# Patient Record
Sex: Female | Born: 1940 | Race: Black or African American | Hispanic: No | Smoking: Never smoker
Health system: Southern US, Community
[De-identification: ages and names within clinical notes are randomized; demographics above are authoritative.]

## PROBLEM LIST (undated history)

## (undated) DIAGNOSIS — I10 Essential (primary) hypertension: Secondary | ICD-10-CM

## (undated) DIAGNOSIS — F419 Anxiety disorder, unspecified: Secondary | ICD-10-CM

## (undated) DIAGNOSIS — M549 Dorsalgia, unspecified: Secondary | ICD-10-CM

## (undated) HISTORY — DX: Anxiety disorder, unspecified: F41.9

## (undated) HISTORY — PX: HIP SURGERY: SHX245

## (undated) HISTORY — PX: OPERATIVE HYSTEROSCOPY: SUR664

## (undated) HISTORY — DX: Essential (primary) hypertension: I10

## (undated) HISTORY — DX: Dorsalgia, unspecified: M54.9

---

## 2005-05-10 ENCOUNTER — Other Ambulatory Visit: Admission: RE | Admit: 2005-05-10 | Discharge: 2005-05-10 | Payer: Self-pay | Admitting: Family Medicine

## 2007-01-12 ENCOUNTER — Encounter: Admission: RE | Admit: 2007-01-12 | Discharge: 2007-01-12 | Payer: Self-pay | Admitting: Family Medicine

## 2009-05-18 ENCOUNTER — Encounter: Admission: RE | Admit: 2009-05-18 | Discharge: 2009-05-18 | Payer: Self-pay | Admitting: Orthopedic Surgery

## 2009-11-12 ENCOUNTER — Encounter: Admission: RE | Admit: 2009-11-12 | Discharge: 2009-11-12 | Payer: Self-pay | Admitting: Orthopedic Surgery

## 2009-12-31 ENCOUNTER — Inpatient Hospital Stay (HOSPITAL_COMMUNITY): Admission: RE | Admit: 2009-12-31 | Discharge: 2010-01-03 | Payer: Self-pay | Admitting: Orthopedic Surgery

## 2010-11-01 LAB — CBC
HCT: 26.4 % — ABNORMAL LOW (ref 36.0–46.0)
HCT: 26.8 % — ABNORMAL LOW (ref 36.0–46.0)
HCT: 37.3 % (ref 36.0–46.0)
Hemoglobin: 13 g/dL (ref 12.0–15.0)
Hemoglobin: 9.5 g/dL — ABNORMAL LOW (ref 12.0–15.0)
Hemoglobin: 9.5 g/dL — ABNORMAL LOW (ref 12.0–15.0)
MCHC: 34.8 g/dL (ref 30.0–36.0)
MCHC: 35.4 g/dL (ref 30.0–36.0)
MCHC: 36 g/dL (ref 30.0–36.0)
MCV: 95.5 fL (ref 78.0–100.0)
MCV: 95.7 fL (ref 78.0–100.0)
MCV: 95.8 fL (ref 78.0–100.0)
Platelets: 210 10*3/uL (ref 150–400)
Platelets: 217 10*3/uL (ref 150–400)
Platelets: 308 10*3/uL (ref 150–400)
RBC: 2.76 MIL/uL — ABNORMAL LOW (ref 3.87–5.11)
RBC: 2.81 MIL/uL — ABNORMAL LOW (ref 3.87–5.11)
RBC: 3.9 MIL/uL (ref 3.87–5.11)
RDW: 12.5 % (ref 11.5–15.5)
RDW: 12.6 % (ref 11.5–15.5)
RDW: 12.8 % (ref 11.5–15.5)
WBC: 12.4 10*3/uL — ABNORMAL HIGH (ref 4.0–10.5)
WBC: 7.1 10*3/uL (ref 4.0–10.5)
WBC: 8.9 10*3/uL (ref 4.0–10.5)

## 2010-11-01 LAB — COMPREHENSIVE METABOLIC PANEL
ALT: 18 U/L (ref 0–35)
AST: 25 U/L (ref 0–37)
Albumin: 3.9 g/dL (ref 3.5–5.2)
Alkaline Phosphatase: 72 U/L (ref 39–117)
BUN: 28 mg/dL — ABNORMAL HIGH (ref 6–23)
CO2: 27 mEq/L (ref 19–32)
Calcium: 9.9 mg/dL (ref 8.4–10.5)
Chloride: 100 mEq/L (ref 96–112)
Creatinine, Ser: 1.21 mg/dL — ABNORMAL HIGH (ref 0.4–1.2)
GFR calc Af Amer: 53 mL/min — ABNORMAL LOW (ref >60)
GFR calc non Af Amer: 44 mL/min — ABNORMAL LOW (ref >60)
Glucose, Bld: 99 mg/dL (ref 70–99)
Potassium: 3.8 mEq/L (ref 3.5–5.1)
Sodium: 139 mEq/L (ref 135–145)
Total Bilirubin: 1.4 mg/dL — ABNORMAL HIGH (ref 0.3–1.2)
Total Protein: 8 g/dL (ref 6.0–8.3)

## 2010-11-01 LAB — PROTIME-INR
INR: 1.18 (ref 0.00–1.49)
INR: 1.35 (ref 0.00–1.49)
INR: 1.79 — ABNORMAL HIGH (ref 0.00–1.49)
INR: 1.8 — ABNORMAL HIGH (ref 0.00–1.49)
Prothrombin Time: 14.9 seconds (ref 11.6–15.2)
Prothrombin Time: 16.6 seconds — ABNORMAL HIGH (ref 11.6–15.2)
Prothrombin Time: 20.6 seconds — ABNORMAL HIGH (ref 11.6–15.2)
Prothrombin Time: 20.7 seconds — ABNORMAL HIGH (ref 11.6–15.2)

## 2010-11-01 LAB — CROSSMATCH
ABO/RH(D): O POS
Antibody Screen: NEGATIVE

## 2010-11-01 LAB — URINALYSIS, ROUTINE W REFLEX MICROSCOPIC
Glucose, UA: NEGATIVE mg/dL
Hgb urine dipstick: NEGATIVE
Ketones, ur: NEGATIVE mg/dL
Nitrite: NEGATIVE
Protein, ur: NEGATIVE mg/dL
Specific Gravity, Urine: 1.022 (ref 1.005–1.030)
Urobilinogen, UA: 0.2 mg/dL (ref 0.0–1.0)
pH: 5 (ref 5.0–8.0)

## 2010-11-01 LAB — ABO/RH: ABO/RH(D): O POS

## 2010-11-01 LAB — URINE MICROSCOPIC-ADD ON

## 2010-11-01 LAB — APTT: aPTT: 28 seconds (ref 24–37)

## 2011-08-17 DIAGNOSIS — I10 Essential (primary) hypertension: Secondary | ICD-10-CM | POA: Diagnosis not present

## 2011-08-17 DIAGNOSIS — Z23 Encounter for immunization: Secondary | ICD-10-CM | POA: Diagnosis not present

## 2011-08-17 DIAGNOSIS — M1250 Traumatic arthropathy, unspecified site: Secondary | ICD-10-CM | POA: Diagnosis not present

## 2011-08-17 DIAGNOSIS — E7800 Pure hypercholesterolemia, unspecified: Secondary | ICD-10-CM | POA: Diagnosis not present

## 2011-08-17 DIAGNOSIS — E781 Pure hyperglyceridemia: Secondary | ICD-10-CM | POA: Diagnosis not present

## 2011-12-16 DIAGNOSIS — I10 Essential (primary) hypertension: Secondary | ICD-10-CM | POA: Diagnosis not present

## 2011-12-16 DIAGNOSIS — R0602 Shortness of breath: Secondary | ICD-10-CM | POA: Diagnosis not present

## 2011-12-16 DIAGNOSIS — E7800 Pure hypercholesterolemia, unspecified: Secondary | ICD-10-CM | POA: Diagnosis not present

## 2011-12-16 DIAGNOSIS — M1250 Traumatic arthropathy, unspecified site: Secondary | ICD-10-CM | POA: Diagnosis not present

## 2012-03-12 DIAGNOSIS — H16109 Unspecified superficial keratitis, unspecified eye: Secondary | ICD-10-CM | POA: Diagnosis not present

## 2012-03-12 DIAGNOSIS — H25099 Other age-related incipient cataract, unspecified eye: Secondary | ICD-10-CM | POA: Diagnosis not present

## 2012-03-26 DIAGNOSIS — Z1231 Encounter for screening mammogram for malignant neoplasm of breast: Secondary | ICD-10-CM | POA: Diagnosis not present

## 2012-04-02 DIAGNOSIS — M542 Cervicalgia: Secondary | ICD-10-CM | POA: Diagnosis not present

## 2012-04-13 DIAGNOSIS — I10 Essential (primary) hypertension: Secondary | ICD-10-CM | POA: Diagnosis not present

## 2012-04-13 DIAGNOSIS — M542 Cervicalgia: Secondary | ICD-10-CM | POA: Diagnosis not present

## 2012-09-14 DIAGNOSIS — E781 Pure hyperglyceridemia: Secondary | ICD-10-CM | POA: Diagnosis not present

## 2012-09-14 DIAGNOSIS — I10 Essential (primary) hypertension: Secondary | ICD-10-CM | POA: Diagnosis not present

## 2012-09-14 DIAGNOSIS — M1250 Traumatic arthropathy, unspecified site: Secondary | ICD-10-CM | POA: Diagnosis not present

## 2012-09-14 DIAGNOSIS — E7800 Pure hypercholesterolemia, unspecified: Secondary | ICD-10-CM | POA: Diagnosis not present

## 2013-01-15 DIAGNOSIS — E7800 Pure hypercholesterolemia, unspecified: Secondary | ICD-10-CM | POA: Diagnosis not present

## 2013-01-15 DIAGNOSIS — I10 Essential (primary) hypertension: Secondary | ICD-10-CM | POA: Diagnosis not present

## 2013-01-15 DIAGNOSIS — M1250 Traumatic arthropathy, unspecified site: Secondary | ICD-10-CM | POA: Diagnosis not present

## 2013-05-15 DIAGNOSIS — E7800 Pure hypercholesterolemia, unspecified: Secondary | ICD-10-CM | POA: Diagnosis not present

## 2013-05-15 DIAGNOSIS — I10 Essential (primary) hypertension: Secondary | ICD-10-CM | POA: Diagnosis not present

## 2013-05-15 DIAGNOSIS — Z23 Encounter for immunization: Secondary | ICD-10-CM | POA: Diagnosis not present

## 2013-06-19 DIAGNOSIS — Z1231 Encounter for screening mammogram for malignant neoplasm of breast: Secondary | ICD-10-CM | POA: Diagnosis not present

## 2013-09-12 DIAGNOSIS — N289 Disorder of kidney and ureter, unspecified: Secondary | ICD-10-CM | POA: Diagnosis not present

## 2013-09-12 DIAGNOSIS — E119 Type 2 diabetes mellitus without complications: Secondary | ICD-10-CM | POA: Diagnosis not present

## 2013-09-12 DIAGNOSIS — I10 Essential (primary) hypertension: Secondary | ICD-10-CM | POA: Diagnosis not present

## 2013-10-23 DIAGNOSIS — Z0000 Encounter for general adult medical examination without abnormal findings: Secondary | ICD-10-CM | POA: Diagnosis not present

## 2013-12-03 DIAGNOSIS — H25099 Other age-related incipient cataract, unspecified eye: Secondary | ICD-10-CM | POA: Diagnosis not present

## 2014-01-15 DIAGNOSIS — T7840XA Allergy, unspecified, initial encounter: Secondary | ICD-10-CM | POA: Diagnosis not present

## 2014-01-15 DIAGNOSIS — I10 Essential (primary) hypertension: Secondary | ICD-10-CM | POA: Diagnosis not present

## 2014-02-07 DIAGNOSIS — R21 Rash and other nonspecific skin eruption: Secondary | ICD-10-CM | POA: Diagnosis not present

## 2014-06-11 DIAGNOSIS — I10 Essential (primary) hypertension: Secondary | ICD-10-CM | POA: Diagnosis not present

## 2014-06-11 DIAGNOSIS — E780 Pure hypercholesterolemia: Secondary | ICD-10-CM | POA: Diagnosis not present

## 2014-06-11 DIAGNOSIS — M1250 Traumatic arthropathy, unspecified site: Secondary | ICD-10-CM | POA: Diagnosis not present

## 2014-06-30 ENCOUNTER — Ambulatory Visit
Admission: RE | Admit: 2014-06-30 | Discharge: 2014-06-30 | Disposition: A | Discharge status: Home / Self Care | Payer: Medicare Other | Source: Ambulatory Visit | Attending: Orthopedic Surgery | Admitting: Orthopedic Surgery

## 2014-06-30 ENCOUNTER — Other Ambulatory Visit: Payer: Self-pay | Admitting: Orthopedic Surgery

## 2014-06-30 DIAGNOSIS — R52 Pain, unspecified: Secondary | ICD-10-CM

## 2014-06-30 DIAGNOSIS — M79661 Pain in right lower leg: Secondary | ICD-10-CM | POA: Diagnosis not present

## 2014-06-30 DIAGNOSIS — R200 Anesthesia of skin: Secondary | ICD-10-CM | POA: Diagnosis not present

## 2014-06-30 DIAGNOSIS — M79662 Pain in left lower leg: Secondary | ICD-10-CM | POA: Diagnosis not present

## 2014-06-30 DIAGNOSIS — D179 Benign lipomatous neoplasm, unspecified: Secondary | ICD-10-CM | POA: Diagnosis not present

## 2014-07-14 DIAGNOSIS — M179 Osteoarthritis of knee, unspecified: Secondary | ICD-10-CM | POA: Diagnosis not present

## 2014-09-12 DIAGNOSIS — E782 Mixed hyperlipidemia: Secondary | ICD-10-CM | POA: Diagnosis not present

## 2014-09-12 DIAGNOSIS — M1250 Traumatic arthropathy, unspecified site: Secondary | ICD-10-CM | POA: Diagnosis not present

## 2014-09-12 DIAGNOSIS — I10 Essential (primary) hypertension: Secondary | ICD-10-CM | POA: Diagnosis not present

## 2014-10-13 DIAGNOSIS — M1990 Unspecified osteoarthritis, unspecified site: Secondary | ICD-10-CM | POA: Diagnosis not present

## 2014-10-22 DIAGNOSIS — Z1231 Encounter for screening mammogram for malignant neoplasm of breast: Secondary | ICD-10-CM | POA: Diagnosis not present

## 2014-12-09 DIAGNOSIS — Z0000 Encounter for general adult medical examination without abnormal findings: Secondary | ICD-10-CM | POA: Diagnosis not present

## 2015-01-01 DIAGNOSIS — H11159 Pinguecula, unspecified eye: Secondary | ICD-10-CM | POA: Diagnosis not present

## 2015-01-01 DIAGNOSIS — H25099 Other age-related incipient cataract, unspecified eye: Secondary | ICD-10-CM | POA: Diagnosis not present

## 2015-01-01 DIAGNOSIS — H18419 Arcus senilis, unspecified eye: Secondary | ICD-10-CM | POA: Diagnosis not present

## 2015-01-01 DIAGNOSIS — H16149 Punctate keratitis, unspecified eye: Secondary | ICD-10-CM | POA: Diagnosis not present

## 2015-01-05 DIAGNOSIS — M7061 Trochanteric bursitis, right hip: Secondary | ICD-10-CM | POA: Diagnosis not present

## 2015-01-06 DIAGNOSIS — I10 Essential (primary) hypertension: Secondary | ICD-10-CM | POA: Diagnosis not present

## 2015-01-06 DIAGNOSIS — E0800 Diabetes mellitus due to underlying condition with hyperosmolarity without nonketotic hyperglycemic-hyperosmolar coma (NKHHC): Secondary | ICD-10-CM | POA: Diagnosis not present

## 2015-01-06 DIAGNOSIS — E780 Pure hypercholesterolemia: Secondary | ICD-10-CM | POA: Diagnosis not present

## 2015-01-13 DIAGNOSIS — M1250 Traumatic arthropathy, unspecified site: Secondary | ICD-10-CM | POA: Diagnosis not present

## 2015-01-13 DIAGNOSIS — E780 Pure hypercholesterolemia: Secondary | ICD-10-CM | POA: Diagnosis not present

## 2015-01-13 DIAGNOSIS — I10 Essential (primary) hypertension: Secondary | ICD-10-CM | POA: Diagnosis not present

## 2015-04-06 DIAGNOSIS — M169 Osteoarthritis of hip, unspecified: Secondary | ICD-10-CM | POA: Diagnosis not present

## 2015-05-13 DIAGNOSIS — Z23 Encounter for immunization: Secondary | ICD-10-CM | POA: Diagnosis not present

## 2015-05-13 DIAGNOSIS — M1250 Traumatic arthropathy, unspecified site: Secondary | ICD-10-CM | POA: Diagnosis not present

## 2015-05-13 DIAGNOSIS — E782 Mixed hyperlipidemia: Secondary | ICD-10-CM | POA: Diagnosis not present

## 2015-05-13 DIAGNOSIS — I10 Essential (primary) hypertension: Secondary | ICD-10-CM | POA: Diagnosis not present

## 2015-05-18 DIAGNOSIS — M169 Osteoarthritis of hip, unspecified: Secondary | ICD-10-CM | POA: Diagnosis not present

## 2015-09-03 DIAGNOSIS — I10 Essential (primary) hypertension: Secondary | ICD-10-CM | POA: Diagnosis not present

## 2015-09-03 DIAGNOSIS — R7309 Other abnormal glucose: Secondary | ICD-10-CM | POA: Diagnosis not present

## 2015-09-03 DIAGNOSIS — E782 Mixed hyperlipidemia: Secondary | ICD-10-CM | POA: Diagnosis not present

## 2015-09-10 DIAGNOSIS — E785 Hyperlipidemia, unspecified: Secondary | ICD-10-CM | POA: Diagnosis not present

## 2015-09-10 DIAGNOSIS — I10 Essential (primary) hypertension: Secondary | ICD-10-CM | POA: Diagnosis not present

## 2015-09-10 DIAGNOSIS — M1250 Traumatic arthropathy, unspecified site: Secondary | ICD-10-CM | POA: Diagnosis not present

## 2015-09-19 DIAGNOSIS — Z1212 Encounter for screening for malignant neoplasm of rectum: Secondary | ICD-10-CM | POA: Diagnosis not present

## 2015-09-19 DIAGNOSIS — Z1211 Encounter for screening for malignant neoplasm of colon: Secondary | ICD-10-CM | POA: Diagnosis not present

## 2015-11-11 DIAGNOSIS — Z1231 Encounter for screening mammogram for malignant neoplasm of breast: Secondary | ICD-10-CM | POA: Diagnosis not present

## 2016-01-06 DIAGNOSIS — H25099 Other age-related incipient cataract, unspecified eye: Secondary | ICD-10-CM | POA: Diagnosis not present

## 2016-01-06 DIAGNOSIS — H5203 Hypermetropia, bilateral: Secondary | ICD-10-CM | POA: Diagnosis not present

## 2016-01-06 DIAGNOSIS — I10 Essential (primary) hypertension: Secondary | ICD-10-CM | POA: Diagnosis not present

## 2016-01-06 DIAGNOSIS — H04123 Dry eye syndrome of bilateral lacrimal glands: Secondary | ICD-10-CM | POA: Diagnosis not present

## 2016-01-06 DIAGNOSIS — H52223 Regular astigmatism, bilateral: Secondary | ICD-10-CM | POA: Diagnosis not present

## 2016-01-06 DIAGNOSIS — H11159 Pinguecula, unspecified eye: Secondary | ICD-10-CM | POA: Diagnosis not present

## 2016-01-06 DIAGNOSIS — H524 Presbyopia: Secondary | ICD-10-CM | POA: Diagnosis not present

## 2016-01-19 DIAGNOSIS — E782 Mixed hyperlipidemia: Secondary | ICD-10-CM | POA: Diagnosis not present

## 2016-01-19 DIAGNOSIS — M1250 Traumatic arthropathy, unspecified site: Secondary | ICD-10-CM | POA: Diagnosis not present

## 2016-01-19 DIAGNOSIS — I10 Essential (primary) hypertension: Secondary | ICD-10-CM | POA: Diagnosis not present

## 2016-01-26 DIAGNOSIS — E785 Hyperlipidemia, unspecified: Secondary | ICD-10-CM | POA: Diagnosis not present

## 2016-01-26 DIAGNOSIS — M1250 Traumatic arthropathy, unspecified site: Secondary | ICD-10-CM | POA: Diagnosis not present

## 2016-01-26 DIAGNOSIS — I10 Essential (primary) hypertension: Secondary | ICD-10-CM | POA: Diagnosis not present

## 2016-01-26 DIAGNOSIS — Z6824 Body mass index (BMI) 24.0-24.9, adult: Secondary | ICD-10-CM | POA: Diagnosis not present

## 2016-04-27 DIAGNOSIS — M25561 Pain in right knee: Secondary | ICD-10-CM | POA: Diagnosis not present

## 2016-04-27 DIAGNOSIS — M25562 Pain in left knee: Secondary | ICD-10-CM | POA: Diagnosis not present

## 2016-06-20 DIAGNOSIS — I10 Essential (primary) hypertension: Secondary | ICD-10-CM | POA: Diagnosis not present

## 2016-06-20 DIAGNOSIS — Z79899 Other long term (current) drug therapy: Secondary | ICD-10-CM | POA: Diagnosis not present

## 2016-06-20 DIAGNOSIS — E782 Mixed hyperlipidemia: Secondary | ICD-10-CM | POA: Diagnosis not present

## 2016-06-20 DIAGNOSIS — E7800 Pure hypercholesterolemia, unspecified: Secondary | ICD-10-CM | POA: Diagnosis not present

## 2016-06-28 DIAGNOSIS — Z23 Encounter for immunization: Secondary | ICD-10-CM | POA: Diagnosis not present

## 2016-06-28 DIAGNOSIS — N189 Chronic kidney disease, unspecified: Secondary | ICD-10-CM | POA: Diagnosis not present

## 2016-06-28 DIAGNOSIS — M1250 Traumatic arthropathy, unspecified site: Secondary | ICD-10-CM | POA: Diagnosis not present

## 2016-06-28 DIAGNOSIS — E782 Mixed hyperlipidemia: Secondary | ICD-10-CM | POA: Diagnosis not present

## 2016-08-03 DIAGNOSIS — I10 Essential (primary) hypertension: Secondary | ICD-10-CM | POA: Diagnosis not present

## 2016-08-03 DIAGNOSIS — N189 Chronic kidney disease, unspecified: Secondary | ICD-10-CM | POA: Diagnosis not present

## 2016-08-03 DIAGNOSIS — M1250 Traumatic arthropathy, unspecified site: Secondary | ICD-10-CM | POA: Diagnosis not present

## 2016-08-03 DIAGNOSIS — E782 Mixed hyperlipidemia: Secondary | ICD-10-CM | POA: Diagnosis not present

## 2016-11-23 DIAGNOSIS — Z1231 Encounter for screening mammogram for malignant neoplasm of breast: Secondary | ICD-10-CM | POA: Diagnosis not present

## 2016-12-26 DIAGNOSIS — E782 Mixed hyperlipidemia: Secondary | ICD-10-CM | POA: Diagnosis not present

## 2016-12-26 DIAGNOSIS — I10 Essential (primary) hypertension: Secondary | ICD-10-CM | POA: Diagnosis not present

## 2016-12-26 DIAGNOSIS — N182 Chronic kidney disease, stage 2 (mild): Secondary | ICD-10-CM | POA: Diagnosis not present

## 2016-12-31 DIAGNOSIS — M1250 Traumatic arthropathy, unspecified site: Secondary | ICD-10-CM | POA: Diagnosis not present

## 2016-12-31 DIAGNOSIS — E785 Hyperlipidemia, unspecified: Secondary | ICD-10-CM | POA: Diagnosis not present

## 2016-12-31 DIAGNOSIS — N189 Chronic kidney disease, unspecified: Secondary | ICD-10-CM | POA: Diagnosis not present

## 2016-12-31 DIAGNOSIS — I10 Essential (primary) hypertension: Secondary | ICD-10-CM | POA: Diagnosis not present

## 2017-01-18 DIAGNOSIS — H25099 Other age-related incipient cataract, unspecified eye: Secondary | ICD-10-CM | POA: Diagnosis not present

## 2017-01-18 DIAGNOSIS — H11159 Pinguecula, unspecified eye: Secondary | ICD-10-CM | POA: Diagnosis not present

## 2017-03-13 DIAGNOSIS — B029 Zoster without complications: Secondary | ICD-10-CM | POA: Diagnosis not present

## 2017-03-22 DIAGNOSIS — B029 Zoster without complications: Secondary | ICD-10-CM | POA: Diagnosis not present

## 2017-05-01 DIAGNOSIS — M1250 Traumatic arthropathy, unspecified site: Secondary | ICD-10-CM | POA: Diagnosis not present

## 2017-05-01 DIAGNOSIS — I10 Essential (primary) hypertension: Secondary | ICD-10-CM | POA: Diagnosis not present

## 2017-05-01 DIAGNOSIS — N189 Chronic kidney disease, unspecified: Secondary | ICD-10-CM | POA: Diagnosis not present

## 2017-05-05 DIAGNOSIS — Z23 Encounter for immunization: Secondary | ICD-10-CM | POA: Diagnosis not present

## 2017-05-05 DIAGNOSIS — M1250 Traumatic arthropathy, unspecified site: Secondary | ICD-10-CM | POA: Diagnosis not present

## 2017-05-05 DIAGNOSIS — I10 Essential (primary) hypertension: Secondary | ICD-10-CM | POA: Diagnosis not present

## 2017-05-05 DIAGNOSIS — E782 Mixed hyperlipidemia: Secondary | ICD-10-CM | POA: Diagnosis not present

## 2017-05-29 DIAGNOSIS — Z0000 Encounter for general adult medical examination without abnormal findings: Secondary | ICD-10-CM | POA: Diagnosis not present

## 2017-09-06 DIAGNOSIS — M1250 Traumatic arthropathy, unspecified site: Secondary | ICD-10-CM | POA: Diagnosis not present

## 2017-09-06 DIAGNOSIS — I10 Essential (primary) hypertension: Secondary | ICD-10-CM | POA: Diagnosis not present

## 2017-09-06 DIAGNOSIS — E785 Hyperlipidemia, unspecified: Secondary | ICD-10-CM | POA: Diagnosis not present

## 2017-09-23 DIAGNOSIS — I10 Essential (primary) hypertension: Secondary | ICD-10-CM | POA: Diagnosis not present

## 2017-09-23 DIAGNOSIS — M1250 Traumatic arthropathy, unspecified site: Secondary | ICD-10-CM | POA: Diagnosis not present

## 2017-09-23 DIAGNOSIS — E785 Hyperlipidemia, unspecified: Secondary | ICD-10-CM | POA: Diagnosis not present

## 2017-11-28 DIAGNOSIS — Z1231 Encounter for screening mammogram for malignant neoplasm of breast: Secondary | ICD-10-CM | POA: Diagnosis not present

## 2017-12-18 DIAGNOSIS — H524 Presbyopia: Secondary | ICD-10-CM | POA: Diagnosis not present

## 2017-12-18 DIAGNOSIS — H52223 Regular astigmatism, bilateral: Secondary | ICD-10-CM | POA: Diagnosis not present

## 2017-12-18 DIAGNOSIS — I10 Essential (primary) hypertension: Secondary | ICD-10-CM | POA: Diagnosis not present

## 2017-12-18 DIAGNOSIS — H11159 Pinguecula, unspecified eye: Secondary | ICD-10-CM | POA: Diagnosis not present

## 2017-12-18 DIAGNOSIS — H5203 Hypermetropia, bilateral: Secondary | ICD-10-CM | POA: Diagnosis not present

## 2017-12-18 DIAGNOSIS — H25099 Other age-related incipient cataract, unspecified eye: Secondary | ICD-10-CM | POA: Diagnosis not present

## 2017-12-18 DIAGNOSIS — H04123 Dry eye syndrome of bilateral lacrimal glands: Secondary | ICD-10-CM | POA: Diagnosis not present

## 2018-01-24 DIAGNOSIS — E782 Mixed hyperlipidemia: Secondary | ICD-10-CM | POA: Diagnosis not present

## 2018-01-24 DIAGNOSIS — J301 Allergic rhinitis due to pollen: Secondary | ICD-10-CM | POA: Diagnosis not present

## 2018-01-24 DIAGNOSIS — I10 Essential (primary) hypertension: Secondary | ICD-10-CM | POA: Diagnosis not present

## 2018-03-15 ENCOUNTER — Other Ambulatory Visit: Payer: Self-pay

## 2018-05-16 DIAGNOSIS — R7309 Other abnormal glucose: Secondary | ICD-10-CM | POA: Diagnosis not present

## 2018-05-16 DIAGNOSIS — E782 Mixed hyperlipidemia: Secondary | ICD-10-CM | POA: Diagnosis not present

## 2018-05-16 DIAGNOSIS — J301 Allergic rhinitis due to pollen: Secondary | ICD-10-CM | POA: Diagnosis not present

## 2018-05-16 DIAGNOSIS — I10 Essential (primary) hypertension: Secondary | ICD-10-CM | POA: Diagnosis not present

## 2018-05-16 DIAGNOSIS — M1250 Traumatic arthropathy, unspecified site: Secondary | ICD-10-CM | POA: Diagnosis not present

## 2018-05-16 DIAGNOSIS — E785 Hyperlipidemia, unspecified: Secondary | ICD-10-CM | POA: Diagnosis not present

## 2018-05-25 DIAGNOSIS — M1250 Traumatic arthropathy, unspecified site: Secondary | ICD-10-CM | POA: Diagnosis not present

## 2018-05-25 DIAGNOSIS — E785 Hyperlipidemia, unspecified: Secondary | ICD-10-CM | POA: Diagnosis not present

## 2018-05-25 DIAGNOSIS — G629 Polyneuropathy, unspecified: Secondary | ICD-10-CM | POA: Diagnosis not present

## 2018-05-25 DIAGNOSIS — I10 Essential (primary) hypertension: Secondary | ICD-10-CM | POA: Diagnosis not present

## 2018-05-25 DIAGNOSIS — Z6824 Body mass index (BMI) 24.0-24.9, adult: Secondary | ICD-10-CM | POA: Diagnosis not present

## 2018-05-30 DIAGNOSIS — Z23 Encounter for immunization: Secondary | ICD-10-CM | POA: Diagnosis not present

## 2018-07-02 ENCOUNTER — Other Ambulatory Visit: Payer: Self-pay

## 2018-07-04 DIAGNOSIS — Z0000 Encounter for general adult medical examination without abnormal findings: Secondary | ICD-10-CM | POA: Diagnosis not present

## 2018-09-25 DIAGNOSIS — M150 Primary generalized (osteo)arthritis: Secondary | ICD-10-CM | POA: Diagnosis not present

## 2018-10-18 DIAGNOSIS — G629 Polyneuropathy, unspecified: Secondary | ICD-10-CM | POA: Diagnosis not present

## 2018-10-18 DIAGNOSIS — Z6824 Body mass index (BMI) 24.0-24.9, adult: Secondary | ICD-10-CM | POA: Diagnosis not present

## 2018-10-18 DIAGNOSIS — M150 Primary generalized (osteo)arthritis: Secondary | ICD-10-CM | POA: Diagnosis not present

## 2018-10-18 DIAGNOSIS — I10 Essential (primary) hypertension: Secondary | ICD-10-CM | POA: Diagnosis not present

## 2018-12-11 DIAGNOSIS — E782 Mixed hyperlipidemia: Secondary | ICD-10-CM | POA: Diagnosis not present

## 2018-12-11 DIAGNOSIS — I10 Essential (primary) hypertension: Secondary | ICD-10-CM | POA: Diagnosis not present

## 2018-12-11 DIAGNOSIS — M150 Primary generalized (osteo)arthritis: Secondary | ICD-10-CM | POA: Diagnosis not present

## 2019-02-26 DIAGNOSIS — I10 Essential (primary) hypertension: Secondary | ICD-10-CM | POA: Diagnosis not present

## 2019-02-26 DIAGNOSIS — M150 Primary generalized (osteo)arthritis: Secondary | ICD-10-CM | POA: Diagnosis not present

## 2019-02-26 DIAGNOSIS — E785 Hyperlipidemia, unspecified: Secondary | ICD-10-CM | POA: Diagnosis not present

## 2019-03-01 DIAGNOSIS — Z1231 Encounter for screening mammogram for malignant neoplasm of breast: Secondary | ICD-10-CM | POA: Diagnosis not present

## 2019-03-29 DIAGNOSIS — G629 Polyneuropathy, unspecified: Secondary | ICD-10-CM | POA: Diagnosis not present

## 2019-03-29 DIAGNOSIS — M130 Polyarthritis, unspecified: Secondary | ICD-10-CM | POA: Diagnosis not present

## 2019-03-29 DIAGNOSIS — I10 Essential (primary) hypertension: Secondary | ICD-10-CM | POA: Diagnosis not present

## 2019-03-29 DIAGNOSIS — E785 Hyperlipidemia, unspecified: Secondary | ICD-10-CM | POA: Diagnosis not present

## 2019-03-29 DIAGNOSIS — Z6825 Body mass index (BMI) 25.0-25.9, adult: Secondary | ICD-10-CM | POA: Diagnosis not present

## 2019-04-03 DIAGNOSIS — H11159 Pinguecula, unspecified eye: Secondary | ICD-10-CM | POA: Diagnosis not present

## 2019-04-03 DIAGNOSIS — H25099 Other age-related incipient cataract, unspecified eye: Secondary | ICD-10-CM | POA: Diagnosis not present

## 2019-04-03 DIAGNOSIS — H5203 Hypermetropia, bilateral: Secondary | ICD-10-CM | POA: Diagnosis not present

## 2019-04-03 DIAGNOSIS — I10 Essential (primary) hypertension: Secondary | ICD-10-CM | POA: Diagnosis not present

## 2019-04-03 DIAGNOSIS — H524 Presbyopia: Secondary | ICD-10-CM | POA: Diagnosis not present

## 2019-04-03 DIAGNOSIS — H52223 Regular astigmatism, bilateral: Secondary | ICD-10-CM | POA: Diagnosis not present

## 2019-04-03 DIAGNOSIS — H04123 Dry eye syndrome of bilateral lacrimal glands: Secondary | ICD-10-CM | POA: Diagnosis not present

## 2019-04-05 DIAGNOSIS — Z23 Encounter for immunization: Secondary | ICD-10-CM | POA: Diagnosis not present

## 2019-07-04 DIAGNOSIS — I10 Essential (primary) hypertension: Secondary | ICD-10-CM | POA: Diagnosis not present

## 2019-07-04 DIAGNOSIS — M130 Polyarthritis, unspecified: Secondary | ICD-10-CM | POA: Diagnosis not present

## 2019-07-04 DIAGNOSIS — E785 Hyperlipidemia, unspecified: Secondary | ICD-10-CM | POA: Diagnosis not present

## 2019-07-05 ENCOUNTER — Other Ambulatory Visit: Payer: Self-pay

## 2019-07-05 ENCOUNTER — Encounter: Payer: Self-pay | Admitting: Orthopaedic Surgery

## 2019-07-05 ENCOUNTER — Ambulatory Visit (INDEPENDENT_AMBULATORY_CARE_PROVIDER_SITE_OTHER): Payer: Medicare Other | Admitting: Orthopaedic Surgery

## 2019-07-05 ENCOUNTER — Ambulatory Visit (INDEPENDENT_AMBULATORY_CARE_PROVIDER_SITE_OTHER): Payer: Medicare Other

## 2019-07-05 VITALS — Ht 66.0 in | Wt 146.0 lb

## 2019-07-05 DIAGNOSIS — Z96641 Presence of right artificial hip joint: Secondary | ICD-10-CM

## 2019-07-05 DIAGNOSIS — M4316 Spondylolisthesis, lumbar region: Secondary | ICD-10-CM | POA: Diagnosis not present

## 2019-07-05 DIAGNOSIS — M79605 Pain in left leg: Secondary | ICD-10-CM | POA: Diagnosis not present

## 2019-07-05 DIAGNOSIS — M79604 Pain in right leg: Secondary | ICD-10-CM | POA: Diagnosis not present

## 2019-07-05 NOTE — Progress Notes (Signed)
Office Visit Note   Patient: Courtney Perkins           Date of Birth: 01-20-1941           MRN: [id number] Visit Date: 07/05/2019              Requested by: No referring provider defined for this encounter. PCP: Lucianne Lei, MD   Assessment & Plan: Visit Diagnoses:  1. Bilateral leg pain   2. Hx of total hip arthroplasty, right   3. Spondylolisthesis, lumbar region     Plan: Patient symptoms are likely coming from her L4-5 spondylolisthesis.  We will set up for some physical therapy and recheck her again in 8 weeks after the holidays.  If she does not improve with physical therapy will need to proceed with lumbar MRI imaging.  She needs to continue to use the cane since she is a fall risk.  We discussed spondylolisthesis, reviewed x-rays and is close to her neurogenic claudication symptoms.  Patient felt great while she was on the prednisone as expected and we discussed increased symptoms for period of time as she weans off the prednisone.  We discussed side effects of long-term prednisone use including compression fractures etc.  I will recheck her in 8 weeks.  Follow-Up Instructions: Return in about 8 weeks (around 08/30/2019).   Orders:  Orders Placed This Encounter  Procedures  . XR Pelvis 1-2 Views  . XR Lumbar Spine 2-3 Views   No orders of the defined types were placed in this encounter.     Procedures: No procedures performed   Clinical Data: No additional findings.   Subjective: Chief Complaint  Patient presents with  . Right Leg - Pain  . Left Leg - Pain    HPI 78 year old seen with bilateral leg pain.  She states she was walking out of the pharmacy felt like her legs were about ready to give out she had to stop standstill I did take a short shuffle type gait.  She has had more right leg pain and left leg pain.  Of note she has been on prednisone for a few months and is weaned down gradually with lower dose and then off.  She has some numbness and tingling  burning in the bottom of both feet.  She has had previous total hip arthroplasty done 10 years ago in the right knee.  She denies neck pain states she is somewhat unsteady on her feet.  Currently her prednisone dose was 2.5 mg.  She denies locking of her knee.  She relates she cannot walk far or stand long.  She leans over a grocery cart when she goes to the store and has to have the cart.  Review of Systems negative for chills or fever.  No bowel bladder associated symptoms.  Degenerative L4-5 spondylolisthesis.   Objective: Vital Signs: Ht 5\' 6"  (1.676 m)   Wt 146 lb (66.2 kg)   BMI 23.57 kg/m   Physical Exam Constitutional:      Appearance: She is well-developed.  HENT:     Head: Normocephalic.     Right Ear: External ear normal.     Left Ear: External ear normal.  Eyes:     Pupils: Pupils are equal, round, and reactive to light.  Neck:     Thyroid: No thyromegaly.     Trachea: No tracheal deviation.  Cardiovascular:     Rate and Rhythm: Normal rate.  Pulmonary:     Effort: Pulmonary effort  is normal.  Abdominal:     Palpations: Abdomen is soft.  Skin:    General: Skin is warm and dry.  Neurological:     Mental Status: She is alert and oriented to person, place, and time.  Psychiatric:        Behavior: Behavior normal.     Ortho Exam patient has mild crepitus of both the right and left knee.  She has some quad weakness worse on the right than left knee.  No pitting edema.  She is ambulatory with a cane.  Some sciatic notch tenderness with pain with straight leg raising 90 degrees both right and left.  Knee and ankle jerk are symmetrical.  Specialty Comments:  No specialty comments available.  Imaging: No results found.   PMFS History: Patient Active Problem List   Diagnosis Date Noted  . Hx of total hip arthroplasty, right 07/09/2019  . Spondylolisthesis, lumbar region 07/09/2019   History reviewed. No pertinent past medical history.  History reviewed. No  pertinent family history.  History reviewed. No pertinent surgical history. Social History   Occupational History  . Not on file  Tobacco Use  . Smoking status: Never Smoker  . Smokeless tobacco: Never Used  Substance and Sexual Activity  . Alcohol use: Not Currently  . Drug use: Not on file  . Sexual activity: Not on file

## 2019-07-09 ENCOUNTER — Encounter: Payer: Self-pay | Admitting: Orthopaedic Surgery

## 2019-07-09 DIAGNOSIS — M4316 Spondylolisthesis, lumbar region: Secondary | ICD-10-CM | POA: Insufficient documentation

## 2019-07-09 DIAGNOSIS — Z0000 Encounter for general adult medical examination without abnormal findings: Secondary | ICD-10-CM | POA: Diagnosis not present

## 2019-07-09 DIAGNOSIS — Z96641 Presence of right artificial hip joint: Secondary | ICD-10-CM | POA: Insufficient documentation

## 2019-07-10 ENCOUNTER — Other Ambulatory Visit: Payer: Self-pay

## 2019-09-21 DIAGNOSIS — Z1212 Encounter for screening for malignant neoplasm of rectum: Secondary | ICD-10-CM | POA: Diagnosis not present

## 2019-09-21 DIAGNOSIS — Z1211 Encounter for screening for malignant neoplasm of colon: Secondary | ICD-10-CM | POA: Diagnosis not present

## 2019-10-15 DIAGNOSIS — Z6824 Body mass index (BMI) 24.0-24.9, adult: Secondary | ICD-10-CM | POA: Diagnosis not present

## 2019-10-15 DIAGNOSIS — E785 Hyperlipidemia, unspecified: Secondary | ICD-10-CM | POA: Diagnosis not present

## 2019-10-15 DIAGNOSIS — I10 Essential (primary) hypertension: Secondary | ICD-10-CM | POA: Diagnosis not present

## 2019-10-15 DIAGNOSIS — M130 Polyarthritis, unspecified: Secondary | ICD-10-CM | POA: Diagnosis not present

## 2019-10-15 DIAGNOSIS — G629 Polyneuropathy, unspecified: Secondary | ICD-10-CM | POA: Diagnosis not present

## 2019-10-17 DIAGNOSIS — Z1211 Encounter for screening for malignant neoplasm of colon: Secondary | ICD-10-CM | POA: Diagnosis not present

## 2019-10-17 DIAGNOSIS — R195 Other fecal abnormalities: Secondary | ICD-10-CM | POA: Diagnosis not present

## 2019-11-04 DIAGNOSIS — Z1211 Encounter for screening for malignant neoplasm of colon: Secondary | ICD-10-CM | POA: Diagnosis not present

## 2019-11-04 DIAGNOSIS — R195 Other fecal abnormalities: Secondary | ICD-10-CM | POA: Diagnosis not present

## 2019-11-26 DIAGNOSIS — M1250 Traumatic arthropathy, unspecified site: Secondary | ICD-10-CM | POA: Diagnosis not present

## 2019-11-26 DIAGNOSIS — F064 Anxiety disorder due to known physiological condition: Secondary | ICD-10-CM | POA: Diagnosis not present

## 2019-11-26 DIAGNOSIS — I10 Essential (primary) hypertension: Secondary | ICD-10-CM | POA: Diagnosis not present

## 2019-11-26 DIAGNOSIS — E785 Hyperlipidemia, unspecified: Secondary | ICD-10-CM | POA: Diagnosis not present

## 2019-12-02 ENCOUNTER — Ambulatory Visit (INDEPENDENT_AMBULATORY_CARE_PROVIDER_SITE_OTHER): Payer: Medicare Other

## 2019-12-02 ENCOUNTER — Other Ambulatory Visit: Payer: Self-pay

## 2019-12-02 ENCOUNTER — Ambulatory Visit (INDEPENDENT_AMBULATORY_CARE_PROVIDER_SITE_OTHER): Payer: Medicare Other | Admitting: Podiatry

## 2019-12-02 DIAGNOSIS — M5416 Radiculopathy, lumbar region: Secondary | ICD-10-CM

## 2019-12-02 DIAGNOSIS — M79672 Pain in left foot: Secondary | ICD-10-CM | POA: Diagnosis not present

## 2019-12-02 DIAGNOSIS — G629 Polyneuropathy, unspecified: Secondary | ICD-10-CM

## 2019-12-02 DIAGNOSIS — M79671 Pain in right foot: Secondary | ICD-10-CM | POA: Diagnosis not present

## 2019-12-03 ENCOUNTER — Telehealth: Payer: Self-pay | Admitting: *Deleted

## 2019-12-03 NOTE — Telephone Encounter (Signed)
Pt's dtr, Paulla Fore states Dr. Amalia Hailey was to send in Gabapentin, but it is not at the [facility] on [address].

## 2019-12-04 ENCOUNTER — Telehealth: Payer: Self-pay | Admitting: Podiatry

## 2019-12-04 ENCOUNTER — Other Ambulatory Visit: Payer: Self-pay | Admitting: Podiatry

## 2019-12-04 DIAGNOSIS — G629 Polyneuropathy, unspecified: Secondary | ICD-10-CM

## 2019-12-04 MED ORDER — GABAPENTIN 100 MG PO CAPS: 100.0000 mg | ORAL_CAPSULE | Freq: Three times a day (TID) | ORAL | 3 refills | Status: AC

## 2019-12-04 NOTE — Telephone Encounter (Signed)
I reviewed LOV clinical for Gabapentin. Dr. Amalia Hailey ordered Gabapentin 100mg  tid. I ordered Gabapentin 100mg  #90 as in the Meds & Orders prompt.

## 2019-12-04 NOTE — Progress Notes (Signed)
   HPI: 79 y.o. female presenting today as a new patient with a chief complaint of intermittent soreness of the bilateral feet that began about 6 months ago. She reports associated pins and needles, numbness and tingling and stiffness of the bilateral lower extremities and the feet. She states it feels like she is walking on gravel. Walking and standing increases the symptoms. She has been rubbing the BLE and feet and soaking them in warm water. Patient is here for further evaluation and treatment.   No past medical history on file.   Physical Exam: General: The patient is alert and oriented x3 in no acute distress.  Dermatology: Skin is warm, dry and supple bilateral lower extremities. Negative for open lesions or macerations.  Vascular: Palpable pedal pulses bilaterally. No edema or erythema noted. Capillary refill within normal limits.  Neurological: Epicritic and protective threshold diminished bilaterally.   Musculoskeletal Exam: Range of motion within normal limits to all pedal and ankle joints bilateral. Muscle strength 5/5 in all groups bilateral.   Radiographic Exam:  Normal osseous mineralization. Joint spaces preserved. No fracture/dislocation/boney destruction.    Assessment: 1. Lumbar radiculopathy BLE 2. BLE peripheral neuropathy    Plan of Care:  1. Patient evaluated. X-Rays reviewed.  2. Recommended good shoe gear.  3. Prescription for Gabapentin 100 mg TID provided to patient.  4. Recommended follow up with ortho spine surgeon.  5. Return to clinic as needed.      Edrick Kins, DPM [facility]  Dr. Edrick Kins, DPM    [address], [place] [address]                Office [phone]  Fax [phone]

## 2019-12-04 NOTE — Telephone Encounter (Signed)
Pt's daughter called stating Dr. Amalia Hailey never called in her prescription. Please advise.

## 2019-12-04 NOTE — Addendum Note (Signed)
Addended by: Harriett Sine D on: 12/04/2019 03:52 PM   Modules accepted: Orders

## 2019-12-24 ENCOUNTER — Other Ambulatory Visit: Payer: Self-pay

## 2019-12-24 ENCOUNTER — Ambulatory Visit (INDEPENDENT_AMBULATORY_CARE_PROVIDER_SITE_OTHER): Payer: Medicare Other | Admitting: Orthopaedic Surgery

## 2019-12-24 ENCOUNTER — Encounter: Payer: Self-pay | Admitting: Orthopaedic Surgery

## 2019-12-24 VITALS — Ht 66.0 in | Wt 146.0 lb

## 2019-12-24 DIAGNOSIS — M4316 Spondylolisthesis, lumbar region: Secondary | ICD-10-CM

## 2019-12-24 DIAGNOSIS — Z96641 Presence of right artificial hip joint: Secondary | ICD-10-CM

## 2019-12-24 MED ORDER — DIAZEPAM 5 MG PO TABS: 5.0000 mg | ORAL_TABLET | Freq: Four times a day (QID) | ORAL | 0 refills | Status: DC | PRN

## 2019-12-24 MED ORDER — TRAMADOL HCL 50 MG PO TABS: 50.0000 mg | ORAL_TABLET | Freq: Two times a day (BID) | ORAL | 0 refills | Status: DC | PRN

## 2019-12-24 NOTE — Progress Notes (Signed)
Office Visit Note   Patient: Courtney Perkins           Date of Birth: Jun 18, 1941           MRN: [id number] Visit Date: 12/24/2019              Requested by: Lucianne Lei, MD [address] [place],  [place] [address] PCP: Lucianne Lei, MD   Assessment & Plan: Visit Diagnoses:  1. Spondylolisthesis, lumbar region   2. Spondylolisthesis of lumbar region   3. Hx of total hip arthroplasty, right     Plan: We reviewed previous radiographs showing her total hip arthroplasty slight lateral screw but acetabular cup solid and 9 shifted and no subsidence of the femoral stem.  Opposite left hip is normal.  Lumbar radiographs which were reviewed with the daughter shows severe collapse at L4-5 with 1.5 degrees spondylolisthesis.  She also has considerable collapse at the L5-S1 disc space.  Ultram sent in for pain.  She previously was on prednisone for several months but is now been off of it.  Will obtain MRI scan to evaluate her for severe compression foraminal on the right side or lateral recess compression with there L4-5 spondylolisthesis.  We discussed possible epidural referral based on findings.  Ultram sent in for pain that she can take if she is having severe pain and not able to stand or walk.  Follow-Up Instructions: Office follow-up after lumbar MRI scan  Orders:  Orders Placed This Encounter  Procedures  . MR Lumbar Spine w/o contrast   Meds ordered this encounter  Medications  . diazepam (VALIUM) 5 MG tablet    Sig: Take 1 tablet (5 mg total) by mouth every 6 (six) hours as needed for anxiety.    Dispense:  2 tablet    Refill:  0  . traMADol (ULTRAM) 50 MG tablet    Sig: Take 1 tablet (50 mg total) by mouth every 12 (twelve) hours as needed.    Dispense:  20 tablet    Refill:  0      Procedures: No procedures performed   Clinical Data: No additional findings.   Subjective: Chief Complaint  Patient presents with  . Lower Back - Follow-up  . Right Leg -  Follow-up  . Left Leg - Follow-up    HPI 79 year old female returns and is here with her daughter.  She woke up this morning with severe pain could not stand cannot walk had to call her son and her daughter.  Previously she was seen with L4-5 spondylolisthesis grade 1.5 and was referred for therapy which she did not do.  She states the pain this morning was severe and is now moderately severe.  Pain radiating from her back to her buttocks into her groin and down her leg to her foot.  She took some gabapentin this morning and arthritis BC powder that thought she had.  Daughter states at times she is sad and cried due to the pain but it is never been as bad as it was this morning.  Patient said problems with bilateral foot pain has the same shoes frequently tried shoes with better arch support without improvement.   Review of Systems updated unchanged other than found on 07/05/2019 office visit other than as mentioned above.     Objective: Vital Signs: Ht 5\' 6"  (1.676 m)   Wt 146 lb (66.2 kg)   BMI 23.57 kg/m   Physical Exam Constitutional:  Appearance: She is well-developed.  HENT:     Head: Normocephalic.     Right Ear: External ear normal.     Left Ear: External ear normal.  Eyes:     Pupils: Pupils are equal, round, and reactive to light.  Neck:     Thyroid: No thyromegaly.     Trachea: No tracheal deviation.  Cardiovascular:     Rate and Rhythm: Normal rate.  Pulmonary:     Effort: Pulmonary effort is normal.  Abdominal:     Palpations: Abdomen is soft.  Skin:    General: Skin is warm and dry.  Neurological:     Mental Status: She is alert and oriented to person, place, and time.  Psychiatric:        Behavior: Behavior normal.     Ortho Exam patient is amatory with a cane in her right hand.  She can ambulate short distances without it.  She walks with hips in a flexed position and bilateral knees in flexed position.  Knees extend almost to neutral.  Collateral  ligaments are stable no pain with hip internal and external rotation she has some sciatic notch tenderness no trochanteric bursal tenderness.  Knee and ankle jerk are intact.  Slight edema right and left foot symmetrical.  Specialty Comments:  No specialty comments available.  Imaging: No results found.   PMFS History: Patient Active Problem List   Diagnosis Date Noted  . Hx of total hip arthroplasty, right 07/09/2019  . Spondylolisthesis, lumbar region 07/09/2019   No past medical history on file.  No family history on file.  No past surgical history on file. Social History   Occupational History  . Not on file  Tobacco Use  . Smoking status: Never Smoker  . Smokeless tobacco: Never Used  Substance and Sexual Activity  . Alcohol use: Not Currently  . Drug use: Not on file  . Sexual activity: Not on file

## 2019-12-25 ENCOUNTER — Telehealth: Payer: Self-pay | Admitting: Orthopaedic Surgery

## 2019-12-25 NOTE — Telephone Encounter (Signed)
Pts daughter Rodena Piety called in wanting to know if the pt was okay to take both the tramadol and the gabapentin or if she was supposed to stop the gabapentin? Rodena Piety would like a call back with this answer.   [phone]

## 2019-12-25 NOTE — Telephone Encounter (Signed)
I called discussed. On low dose neurontin so OK for both. MRI pending

## 2019-12-25 NOTE — Telephone Encounter (Signed)
Please advise 

## 2019-12-25 NOTE — Telephone Encounter (Signed)
Received call from pts son, Juanda Crumble. He wanting to check if we have received [employer] forms for him fom his employer to care for his mother. I checked with [employer],advised him we have not received anything as of yet. I suggested he reach back out to [employer]. He stated he would and also will come by tomorrow to pay fee and release.

## 2020-01-02 ENCOUNTER — Telehealth: Payer: Self-pay | Admitting: Orthopaedic Surgery

## 2020-01-02 ENCOUNTER — Other Ambulatory Visit: Payer: Medicare Other

## 2020-01-02 NOTE — Telephone Encounter (Signed)
LMOM for patient to call the office to scheduled their MRI review with Dr. Lorin Mercy after June 10th.  Thank you.

## 2020-01-08 ENCOUNTER — Telehealth: Payer: Self-pay | Admitting: Orthopaedic Surgery

## 2020-01-08 ENCOUNTER — Other Ambulatory Visit: Payer: Self-pay

## 2020-01-08 MED ORDER — TRAMADOL HCL 50 MG PO TABS: 50.0000 mg | ORAL_TABLET | Freq: Two times a day (BID) | ORAL | 0 refills | Status: DC | PRN

## 2020-01-08 NOTE — Telephone Encounter (Signed)
Called into pharmacy

## 2020-01-08 NOTE — Telephone Encounter (Signed)
Patient's daughter Jae Dire called advised patient need Rx refilled (tramadol) The number to contact Rodena Piety is [phone]

## 2020-01-08 NOTE — Telephone Encounter (Signed)
OK last refill thanks

## 2020-01-08 NOTE — Telephone Encounter (Signed)
Please advise 

## 2020-01-23 ENCOUNTER — Ambulatory Visit
Admission: RE | Admit: 2020-01-23 | Discharge: 2020-01-23 | Disposition: A | Discharge status: Home / Self Care | Payer: Medicare Other | Source: Ambulatory Visit | Attending: Orthopaedic Surgery | Admitting: Orthopaedic Surgery

## 2020-01-23 ENCOUNTER — Other Ambulatory Visit: Payer: Medicare Other

## 2020-01-23 ENCOUNTER — Other Ambulatory Visit: Payer: Self-pay

## 2020-01-23 DIAGNOSIS — M4316 Spondylolisthesis, lumbar region: Secondary | ICD-10-CM

## 2020-01-23 DIAGNOSIS — M48061 Spinal stenosis, lumbar region without neurogenic claudication: Secondary | ICD-10-CM | POA: Diagnosis not present

## 2020-01-27 DIAGNOSIS — E785 Hyperlipidemia, unspecified: Secondary | ICD-10-CM | POA: Diagnosis not present

## 2020-01-27 DIAGNOSIS — G629 Polyneuropathy, unspecified: Secondary | ICD-10-CM | POA: Diagnosis not present

## 2020-01-27 DIAGNOSIS — I10 Essential (primary) hypertension: Secondary | ICD-10-CM | POA: Diagnosis not present

## 2020-01-27 DIAGNOSIS — M130 Polyarthritis, unspecified: Secondary | ICD-10-CM | POA: Diagnosis not present

## 2020-01-28 ENCOUNTER — Encounter: Payer: Self-pay | Admitting: Orthopaedic Surgery

## 2020-01-28 ENCOUNTER — Other Ambulatory Visit: Payer: Self-pay

## 2020-01-28 ENCOUNTER — Ambulatory Visit: Payer: Self-pay

## 2020-01-28 ENCOUNTER — Ambulatory Visit (INDEPENDENT_AMBULATORY_CARE_PROVIDER_SITE_OTHER): Payer: Medicare Other | Admitting: Orthopaedic Surgery

## 2020-01-28 VITALS — BP 123/61 | HR 61 | Ht 66.0 in | Wt 146.0 lb

## 2020-01-28 DIAGNOSIS — M4316 Spondylolisthesis, lumbar region: Secondary | ICD-10-CM | POA: Diagnosis not present

## 2020-01-28 MED ORDER — TRAMADOL HCL 50 MG PO TABS: 50.0000 mg | ORAL_TABLET | Freq: Two times a day (BID) | ORAL | 0 refills | Status: DC | PRN

## 2020-01-28 NOTE — Progress Notes (Signed)
Office Visit Note   Patient: Courtney Perkins           Date of Birth: January 30, 1941           MRN: [id number] Visit Date: 01/28/2020              Requested by: Courtney Lei, MD [address] [place],  [place] [address] PCP: Courtney Lei, MD   Assessment & Plan: Visit Diagnoses:  1. Spondylolisthesis of lumbar region     Plan: Patient has 10 mm of anterolisthesis at L4-5 but no motion on flexion-extension x-rays obtained today.  We reviewed with the patient and her daughter again the MRI scan which shows severe stenosis at L4-5 with anterolisthesis and foraminal stenosis.  Her principal problem is been neurogenic claudication and we discussed that she could get some improvement with central decompression.  She would need medical clearance if she decides to proceed.  There was changes on the MRI that was suggestive of possible right sacral insufficiency fracture.  She is currently not having any symptoms that would be consistent with this diagnosis.  We will check her back in a month we discussed surgical options which would be single level decompression at the L4-5 level for stenosis.  Since she is not unstable I discussed that I do not think she would need to have fusion performed.  Normally should be in the hospital overnight and use a walker postop for a period of a few weeks and work on a home walking program.  I will check her back again in 1 month.  Follow-Up Instructions: Return in about 1 month (around 02/27/2020).   Orders:  Orders Placed This Encounter  Procedures  . XR Lumb Spine Flex&Ext Only   Meds ordered this encounter  Medications  . traMADol (ULTRAM) 50 MG tablet    Sig: Take 1 tablet (50 mg total) by mouth every 12 (twelve) hours as needed.    Dispense:  20 tablet    Refill:  0      Procedures: No procedures performed   Clinical Data: No additional findings.   Subjective: Chief Complaint  Patient presents with  . Lower Back - Pain    MRI Lumbar Review     HPI 79 year old female returns with her daughter with ongoing problems with lumbar spinal stenosis with neurogenic claudication.  She states she cannot walk three fourths of a block she can only stand for about 4 to 5 minutes.  She has grade 1.5 spondylolisthesis with complete collapse of disc space.  She states her feet feel like they get heavy and she is not able to walk and gets relief after she sits for 5 to 10 minutes.  No associated bowel or bladder symptoms no fever or chills.  No previous lumbar surgeries.  Review of Systems updated unchanged from 07/05/2019 office visit other than as mentioned above.   Objective: Vital Signs: BP 123/61   Pulse 61   Ht 5\' 6"  (1.676 m)   Wt 146 lb (66.2 kg)   BMI 23.57 kg/m   Physical Exam Constitutional:      Appearance: She is well-developed.  HENT:     Head: Normocephalic.     Right Ear: External ear normal.     Left Ear: External ear normal.  Eyes:     Pupils: Pupils are equal, round, and reactive to light.  Neck:     Thyroid: No thyromegaly.     Trachea: No tracheal deviation.  Cardiovascular:  Rate and Rhythm: Normal rate.  Pulmonary:     Effort: Pulmonary effort is normal.  Abdominal:     Palpations: Abdomen is soft.  Skin:    General: Skin is warm and dry.  Neurological:     Mental Status: She is alert and oriented to person, place, and time.  Psychiatric:        Behavior: Behavior normal.     Ortho Exam negative logroll the hips knee and ankle jerk are intact anterior tib gastrocsoleus is strong negative straight leg raising she has some mild sciatic notch tenderness right and left.  Pedal pulses are palpable.  Specialty Comments:  No specialty comments available.  Imaging: XR Lumb Spine Flex&Ext Only  Result Date: 01/28/2020 Lateral flexion-extension lumbar x-rays are obtained and reviewed.  This shows 10 mm of anterolisthesis at L4-5 level which does not change on flexion-extension.  There is collapse of the  disc space at L4-5 and significant narrowing L5-S1. Impression: Degenerative anterolisthesis L4-5 without motion on flexion-extension films.    PMFS History: Patient Active Problem List   Diagnosis Date Noted  . Hx of total hip arthroplasty, right 07/09/2019  . Spondylolisthesis, lumbar region 07/09/2019   No past medical history on file.  No family history on file.  No past surgical history on file. Social History   Occupational History  . Not on file  Tobacco Use  . Smoking status: Never Smoker  . Smokeless tobacco: Never Used  Substance and Sexual Activity  . Alcohol use: Not Currently  . Drug use: Not on file  . Sexual activity: Not on file

## 2020-02-26 ENCOUNTER — Encounter: Payer: Self-pay | Admitting: Orthopaedic Surgery

## 2020-02-26 ENCOUNTER — Ambulatory Visit (INDEPENDENT_AMBULATORY_CARE_PROVIDER_SITE_OTHER): Payer: Medicare Other | Admitting: Orthopaedic Surgery

## 2020-02-26 DIAGNOSIS — M48061 Spinal stenosis, lumbar region without neurogenic claudication: Secondary | ICD-10-CM | POA: Insufficient documentation

## 2020-02-26 DIAGNOSIS — M48062 Spinal stenosis, lumbar region with neurogenic claudication: Secondary | ICD-10-CM | POA: Diagnosis not present

## 2020-02-26 MED ORDER — TRAMADOL HCL 50 MG PO TABS: 50.0000 mg | ORAL_TABLET | Freq: Two times a day (BID) | ORAL | 0 refills | Status: DC | PRN

## 2020-02-26 NOTE — Progress Notes (Signed)
Office Visit Note   Patient: Courtney Perkins           Date of Birth: 1941-06-25           MRN: [id number] Visit Date: 02/26/2020              Requested by: Lucianne Lei, [address] [place],   [address] PCP: Lucianne Lei, MD   Assessment & Plan: Visit Diagnoses:  1. Spinal stenosis of lumbar region with neurogenic claudication     Plan: Today we repeated a discussion about recommendation for single level fusion at L4-5.  She has had complete collapse of the disc space does not shift on flexion-extension but does have grade 1 to 1.5 degree anterolisthesis at the L4-5 level.  Her symptoms occur when she stands and walks with claudication.  She does have severe foraminal stenosis but is not having radicular symptoms except with prolonged standing or walking.  We discussed instrumented fusion versus simple decompression or decompression and fusion without instrumentation.  We will proceed with decompression surgery at L4-5.  Discussed with patient and her daughter the potential that she could begin to shift and becomes unstable requiring later surgery.  Questions were elicited and answered.  Follow-Up Instructions: No follow-ups on file.   Orders:  No orders of the defined types were placed in this encounter.  No orders of the defined types were placed in this encounter.     Procedures: No procedures performed   Clinical Data: No additional findings.   Subjective: Chief Complaint  Patient presents with  . Lower Back - Pain, Follow-up    HPI 79 year old female returns she is here with both her children to discuss surgical treatment for her neurogenic claudication symptoms from lumbar spinal stenosis at L4-5.  Patient can walk slightly more than 100 feet she can stand for less than 5 minutes has to sit down she gets relief with sitting.  She Sheran Spine goes to the grocery store but used to have to [employer] on the grocery cart and states she is unable to make it into  the store to get to the cart at this point.  Her children are helping with groceries.  She has pain with standing or walking difficulty getting up in the morning frequently spent in a forward flexed position.  Patient has degenerative grade 1 spondylolisthesis 7 mm with complete collapse of L4-5 no change with flexion extension x-rays.  She has severe central stenosis and severe foraminal stenosis.  Old L1 compression fracture of 30% which is healed and stable.  She has more pain in her left leg after standing the right leg along with back pain and bilateral leg weakness.  She gets relief with sitting.    Review of Systems patient is followed by Dr. Darlyne Russian.   Objective: Vital Signs: BP (!) 149/83   Pulse 93   Physical Exam Constitutional:      Appearance: She is well-developed.  HENT:     Head: Normocephalic.     Right Ear: External ear normal.     Left Ear: External ear normal.  Eyes:     Pupils: Pupils are equal, round, and reactive to light.  Neck:     Thyroid: No thyromegaly.     Trachea: No tracheal deviation.  Cardiovascular:     Rate and Rhythm: Normal rate.  Pulmonary:     Effort: Pulmonary effort is normal.  Abdominal:     Palpations: Abdomen is soft.  Skin:    General: Skin is warm and dry.  Neurological:     Mental Status: She is alert and oriented to person, place, and time.  Psychiatric:        Behavior: Behavior normal.     Ortho Exam patient has negative logroll right and left hip.  Anterior tib gastrocsoleus is strong some pain with straight leg raising 80 degrees on the right positive sciatic notch tenderness.  She has slow short stride deliberate gait.  Specialty Comments:  No specialty comments available.  Imaging: No results found.   PMFS History: Patient Active Problem List   Diagnosis Date Noted  . Spinal stenosis of lumbar region 02/26/2020  . Hx of total hip arthroplasty, right 07/09/2019  . Spondylolisthesis, lumbar region 07/09/2019    History reviewed. No pertinent past medical history.  History reviewed. No pertinent family history.  History reviewed. No pertinent surgical history. Social History   Occupational History  . Not on file  Tobacco Use  . Smoking status: Never Smoker  . Smokeless tobacco: Never Used  Substance and Sexual Activity  . Alcohol use: Not Currently  . Drug use: Not on file  . Sexual activity: Not on file

## 2020-03-06 ENCOUNTER — Other Ambulatory Visit: Payer: Self-pay

## 2020-03-10 DIAGNOSIS — Z6824 Body mass index (BMI) 24.0-24.9, adult: Secondary | ICD-10-CM | POA: Diagnosis not present

## 2020-03-10 DIAGNOSIS — I10 Essential (primary) hypertension: Secondary | ICD-10-CM | POA: Diagnosis not present

## 2020-03-10 DIAGNOSIS — M130 Polyarthritis, unspecified: Secondary | ICD-10-CM | POA: Diagnosis not present

## 2020-03-10 DIAGNOSIS — M1310 Monoarthritis, not elsewhere classified, unspecified site: Secondary | ICD-10-CM | POA: Diagnosis not present

## 2020-03-10 DIAGNOSIS — Z01818 Encounter for other preprocedural examination: Secondary | ICD-10-CM | POA: Diagnosis not present

## 2020-03-13 DIAGNOSIS — I270 Primary pulmonary hypertension: Secondary | ICD-10-CM | POA: Diagnosis not present

## 2020-03-13 DIAGNOSIS — I10 Essential (primary) hypertension: Secondary | ICD-10-CM | POA: Diagnosis not present

## 2020-03-13 DIAGNOSIS — M150 Primary generalized (osteo)arthritis: Secondary | ICD-10-CM | POA: Diagnosis not present

## 2020-03-13 DIAGNOSIS — E785 Hyperlipidemia, unspecified: Secondary | ICD-10-CM | POA: Diagnosis not present

## 2020-03-13 NOTE — Progress Notes (Signed)
Patient referred by Lucianne Lei, MD for pre-op risk stratification  Subjective:   De Nurse, female    DOB: January 25, 1941, 79 y.o.   MRN: [id number]   Chief Complaint  Patient presents with  . Pre-op Exam  . New Patient (Initial Visit)  . Hypertension     HPI  79 y.o. African American female with hypertension, pre-op risk stratification prior to lumbar decompression surgery.   Patient seen Dr. Lorin Mercy for what appeared to be lumbar radiculopathy symptoms.  She is scheduled to undergo lumbar decompression surgery on 04/11/2019 she was referred for preop evaluation given abnormal EKG.  LIMS  Patient is here with her husband.  She has been active all her life.  However, activity is severely limited since November 2020 owing to her back pain and radiculopathy symptoms.  She denies chest pain, shortness of breath symptoms.  Her hypertension is managed by her PCP Dr.Bland. Blood pressure usually lower than this at baseline.   Past Medical History:  Diagnosis Date  . Anxiety   . Back ache   . Hypertension      Past Surgical History:  Procedure Laterality Date  . HIP SURGERY Right   . OPERATIVE HYSTEROSCOPY       Social History   Tobacco Use  Smoking Status Never Smoker  Smokeless Tobacco Never Used    Social History   Substance and Sexual Activity  Alcohol Use Not Currently     History reviewed. No pertinent family history.   Current Outpatient Medications on File Prior to Visit  Medication Sig Dispense Refill  . diazepam (VALIUM) 5 MG tablet Take 1 tablet (5 mg total) by mouth every 6 (six) hours as needed for anxiety. 2 tablet 0  . gabapentin (NEURONTIN) 100 MG capsule Take 1 capsule (100 mg total) by mouth 3 (three) times daily. 90 capsule 3  . lisinopril-hydrochlorothiazide (ZESTORETIC) 20-25 MG tablet Take 1 tablet by mouth daily.    . predniSONE (DELTASONE) 2.5 MG tablet Take 2.5 mg by mouth daily.    Manus Gunning BOWEL PREP KIT 17.5-3.13-1.6 GM/177ML  SOLN See admin instructions.    . traMADol (ULTRAM) 50 MG tablet Take 1 tablet (50 mg total) by mouth every 12 (twelve) hours as needed. 20 tablet 0   No current facility-administered medications on file prior to visit.    Cardiovascular and other pertinent studies:  EKG 03/16/2020: Sinus rhythm 83 bpm. Left anterior fascicular block.  Possible old anteroseptal infarct.  Poor R-wave progression. Occasional PAC.   Recent labs: 03/10/2020: Glucose 91, BUN/Cr 19/1.05. EGFR 58. Na/K 134/4.0.  Chloride: 97, Calcium: 10.5 H/H 11.7/34.0. MCV 98.3. Platelets 286 Chol 205, TG 149, HDL 48, LDL 131   Review of Systems  Cardiovascular: Negative for chest pain, dyspnea on exertion, leg swelling, palpitations and syncope.  Musculoskeletal: Positive for back pain.  Neurological: Positive for sensory change.         Vitals:   03/16/20 1330 03/16/20 1337  BP: (!) 180/93 (!) 173/87  Pulse: 88 95  Resp: 17   SpO2: 99% 99%     Body mass index is 23.24 kg/m. Filed Weights   03/16/20 1330  Weight: 144 lb (65.3 kg)    Objective:   Physical Exam Vitals and nursing note reviewed.  Constitutional:      General: She is not in acute distress. Neck:     Vascular: No JVD.  Cardiovascular:     Rate and Rhythm: Normal rate and regular rhythm.  Heart sounds: Normal heart sounds. No murmur heard.   Pulmonary:     Effort: Pulmonary effort is normal.     Breath sounds: Normal breath sounds. No wheezing or rales.            Assessment & Recommendations:    79 y.o. African American female with hypertension, pre-op risk stratification prior to lumbar decompression surgery.   Pre-op risk stratification: Quality of life is severely limited with back pain and radiculopathy symptoms. No symptoms of angina or dyspnea.  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  While echocardiogram will help establish baseline cardiac function, I do  not think it will alter management plan of decompression surgery as scheduled.  Also, I do not think stress test is necessary given absence of anginal symptoms.  Again, further testing will not change plan for decompression surgery. Overall, risk of major adverse cardiac events is elevated (owing to her age, hypertension, hyperlipidemia ), but acceptable. She is not interested in starting any statin therapy. I have encouraged her to follow-up with her PCP to ensure blood pressure control prior to proceeding with the surgery.  Thank you for referring the patient to [place]. Please feel free to contact with any questions.  Nigel Mormon, MD

## 2020-03-16 ENCOUNTER — Other Ambulatory Visit: Payer: Self-pay

## 2020-03-16 ENCOUNTER — Ambulatory Visit: Payer: Medicare Other | Admitting: Cardiology

## 2020-03-16 ENCOUNTER — Encounter: Payer: Self-pay | Admitting: Cardiology

## 2020-03-16 VITALS — BP 173/87 | HR 95 | Resp 17 | Ht 66.0 in | Wt 144.0 lb

## 2020-03-16 DIAGNOSIS — R9431 Abnormal electrocardiogram [ECG] [EKG]: Secondary | ICD-10-CM | POA: Insufficient documentation

## 2020-03-16 DIAGNOSIS — I10 Essential (primary) hypertension: Secondary | ICD-10-CM

## 2020-03-16 DIAGNOSIS — Z01818 Encounter for other preprocedural examination: Secondary | ICD-10-CM | POA: Diagnosis not present

## 2020-03-25 ENCOUNTER — Ambulatory Visit (INDEPENDENT_AMBULATORY_CARE_PROVIDER_SITE_OTHER): Payer: Medicare Other | Admitting: Surgery

## 2020-03-25 ENCOUNTER — Encounter: Payer: Self-pay | Admitting: Surgery

## 2020-03-25 VITALS — BP 146/75 | HR 78 | Ht 66.0 in | Wt 143.0 lb

## 2020-03-25 DIAGNOSIS — M48062 Spinal stenosis, lumbar region with neurogenic claudication: Secondary | ICD-10-CM

## 2020-03-25 NOTE — Progress Notes (Signed)
79 year old female with history of L4-5 stenosis, back pain and lower extremity radiculopathy comes in for preop evaluation. States that symptoms unchanged from previous visit. She is wanting to proceed with L4-5 decompression as scheduled. Today history and physical performed. We have received preop cardiac clearance. Review of systems negative. Surgical procedure discussed along with potential hospital stay. All questions answered and she wishes to proceed.

## 2020-03-31 NOTE — H&P (Signed)
Courtney Perkins is an 79 y.o. female.   Chief Complaint: Back pain and lower extremity radiculopathy HPI:  79 year old female with history of L4-5 stenosis, back pain and lower extremity radiculopathy comes in for preop evaluation. States that symptoms unchanged from previous visit  Past Medical History:  Diagnosis Date  . Anxiety   . Back ache   . Hypertension     Past Surgical History:  Procedure Laterality Date  . HIP SURGERY Right   . OPERATIVE HYSTEROSCOPY      No family history on file. Social History:  reports that she has never smoked. She has never used smokeless tobacco. She reports previous alcohol use. She reports previous drug use.  Allergies: No Known Allergies  No medications prior to admission.    No results found for this or any previous visit (from the past 48 hour(s)). No results found.  Review of Systems  Constitutional: Positive for activity change.  HENT: Negative.   Respiratory: Negative.   Cardiovascular: Negative.   Gastrointestinal: Negative.   Genitourinary: Negative.   Musculoskeletal: Positive for back pain and gait problem.  Psychiatric/Behavioral: Negative.     There were no vitals taken for this visit. Physical Exam HENT:     Head: Normocephalic and atraumatic.     Nose: Nose normal.     Mouth/Throat:     Mouth: Mucous membranes are moist.  Eyes:     Extraocular Movements: Extraocular movements intact.     Pupils: Pupils are equal, round, and reactive to light.  Cardiovascular:     Heart sounds: Normal heart sounds.  Pulmonary:     Effort: Pulmonary effort is normal. No respiratory distress.     Breath sounds: Normal breath sounds.  Abdominal:     General: Bowel sounds are normal. There is no distension.  Musculoskeletal:        General: Tenderness present.  Neurological:     Mental Status: She is alert and oriented to person, place, and time.  Psychiatric:        Mood and Affect: Mood normal.       Assessment/Plan L4-5 stenosis   We will proceed with L4-5 decompression as scheduled.  Surgical procedure discussed along with potential hospital stay.  All questions answered and she wishes to proceed.  Benjiman Core, PA-C 03/31/2020, 3:57 PM

## 2020-03-31 NOTE — Progress Notes (Addendum)
Your procedure is scheduled on Friday August 27.  Report to [facility] Main Entrance "A" at 08:30 A.M., and check in at the Admitting office.  Call this number if you have problems the morning of surgery: [phone]  Call [phone] if you have any questions prior to your surgery date Monday-Friday 8am-4pm   Remember: Do not eat after midnight the night before your surgery  You may drink clear liquids until 07:30 A.M. the morning of your surgery.   Clear liquids allowed are: Water, Non-Citrus Juices (without pulp), Carbonated Beverages, Clear Tea, Black Coffee Only, and Gatorade   Please complete your PRE-SURGERY ENSURE that was provided to you by 07:30 A.M. the morning of your surgery.    Please, if able, drink it in one setting. DO NOT SIP.    Take these medicines the morning of surgery with A SIP OF WATER: NONE  If needed: gabapentin (NEURONTIN)  traMADol (ULTRAM)   As of today, STOP taking any Aspirin (unless otherwise instructed by your surgeon), Aleve, Naproxen, Ibuprofen, Motrin, Advil, Goody's, BC's, all herbal medications, fish oil, and all vitamins.    The Morning of Surgery  Do not wear jewelry, make-up or nail polish.  Do not wear lotions, powders, or perfumes, or deodorant  Do not shave 48 hours prior to surgery.    Do not bring valuables to the hospital.  [facility] is not responsible for any belongings or valuables.  If you are a smoker, DO NOT Smoke 24 hours prior to surgery  If you wear a CPAP at night please bring your mask the morning of surgery   Remember that you must have someone to transport you home after your surgery, and remain with you for 24 hours if you are discharged the same day.   Please bring cases for contacts, glasses, hearing aids, dentures or bridgework because it cannot be worn into surgery.    Leave your suitcase in the car.  After surgery it may be brought to your room.  For patients admitted to the hospital, discharge  time will be determined by your treatment team.  Patients discharged the day of surgery will not be allowed to drive home.    Special instructions:   [place]- Preparing For Surgery  Before surgery, you can play an important role. Because skin is not sterile, your skin needs to be as free of germs as possible. You can reduce the number of germs on your skin by washing with CHG (chlorahexidine gluconate) Soap before surgery.  CHG is an antiseptic cleaner which kills germs and bonds with the skin to continue killing germs even after washing.    Oral Hygiene is also important to reduce your risk of infection.  Remember - BRUSH YOUR TEETH THE MORNING OF SURGERY WITH YOUR REGULAR TOOTHPASTE  Please do not use if you have an allergy to CHG or antibacterial soaps. If your skin becomes reddened/irritated stop using the CHG.  Do not shave (including legs and underarms) for at least 48 hours prior to first CHG shower. It is OK to shave your face.  Please follow these instructions carefully.   1. Shower the NIGHT BEFORE SURGERY and the MORNING OF SURGERY with CHG Soap.   2. If you chose to wash your hair and body, wash as usual with your normal shampoo and body-wash/soap.  3. Rinse your hair and body thoroughly to remove the shampoo and soap.  4. Apply CHG directly to the skin (ONLY FROM THE NECK DOWN) and  wash gently with a scrungie or a clean washcloth.   5. Do not use on open wounds or open sores. Avoid contact with your eyes, ears, mouth and genitals (private parts). Wash Face and genitals (private parts)  with your normal soap.   6. Wash thoroughly, paying special attention to the area where your surgery will be performed.  7. Thoroughly rinse your body with warm water from the neck down.  8. DO NOT shower/wash with your normal soap after using and rinsing off the CHG Soap.  9. Pat yourself dry with a CLEAN TOWEL.  10. Wear CLEAN PAJAMAS to bed the night before surgery  11. Place  CLEAN SHEETS on your bed the night of your first shower and DO NOT SLEEP WITH PETS.  12. Wear comfortable clothes the morning of surgery.     Day of Surgery:  Please shower the morning of surgery with the CHG soap Do not apply any deodorants/lotions. Please wear clean clothes to the hospital/surgery center.   Remember to brush your teeth WITH YOUR REGULAR TOOTHPASTE.   Please read over the following fact sheets that you were given.

## 2020-04-01 ENCOUNTER — Ambulatory Visit (HOSPITAL_COMMUNITY)
Admission: RE | Admit: 2020-04-01 | Discharge: 2020-04-01 | Disposition: A | Discharge status: Home / Self Care | Payer: Medicare Other | Source: Ambulatory Visit | Attending: Surgery | Admitting: Surgery

## 2020-04-01 ENCOUNTER — Encounter (HOSPITAL_COMMUNITY)
Admission: RE | Admit: 2020-04-01 | Discharge: 2020-04-01 | Disposition: A | Discharge status: Home / Self Care | Payer: Medicare Other | Source: Ambulatory Visit | Attending: Orthopaedic Surgery | Admitting: Orthopaedic Surgery

## 2020-04-01 ENCOUNTER — Other Ambulatory Visit: Payer: Self-pay

## 2020-04-01 ENCOUNTER — Encounter (HOSPITAL_COMMUNITY): Payer: Self-pay

## 2020-04-01 ENCOUNTER — Ambulatory Visit (HOSPITAL_COMMUNITY): Payer: Medicare Other

## 2020-04-01 DIAGNOSIS — Z01818 Encounter for other preprocedural examination: Secondary | ICD-10-CM

## 2020-04-01 LAB — CBC
HCT: 35.8 % — ABNORMAL LOW (ref 36.0–46.0)
Hemoglobin: 11.8 g/dL — ABNORMAL LOW (ref 12.0–15.0)
MCH: 33 pg (ref 26.0–34.0)
MCHC: 33 g/dL (ref 30.0–36.0)
MCV: 100 fL (ref 80.0–100.0)
Platelets: 304 10*3/uL (ref 150–400)
RBC: 3.58 MIL/uL — ABNORMAL LOW (ref 3.87–5.11)
RDW: 12.5 % (ref 11.5–15.5)
WBC: 9.7 10*3/uL (ref 4.0–10.5)
nRBC: 0 % (ref 0.0–0.2)

## 2020-04-01 LAB — COMPREHENSIVE METABOLIC PANEL
ALT: 14 U/L (ref 0–44)
AST: 21 U/L (ref 15–41)
Albumin: 4 g/dL (ref 3.5–5.0)
Alkaline Phosphatase: 72 U/L (ref 38–126)
Anion gap: 12 (ref 5–15)
BUN: 19 mg/dL (ref 8–23)
CO2: 26 mmol/L (ref 22–32)
Calcium: 10.3 mg/dL (ref 8.9–10.3)
Chloride: 97 mmol/L — ABNORMAL LOW (ref 98–111)
Creatinine, Ser: 1.08 mg/dL — ABNORMAL HIGH (ref 0.44–1.00)
GFR calc Af Amer: 56 mL/min — ABNORMAL LOW (ref >60)
GFR calc non Af Amer: 48 mL/min — ABNORMAL LOW (ref >60)
Glucose, Bld: 95 mg/dL (ref 70–99)
Potassium: 3.4 mmol/L — ABNORMAL LOW (ref 3.5–5.1)
Sodium: 135 mmol/L (ref 135–145)
Total Bilirubin: 0.8 mg/dL (ref 0.3–1.2)
Total Protein: 8.5 g/dL — ABNORMAL HIGH (ref 6.5–8.1)

## 2020-04-01 LAB — URINALYSIS, ROUTINE W REFLEX MICROSCOPIC
Bilirubin Urine: NEGATIVE
Glucose, UA: NEGATIVE mg/dL
Hgb urine dipstick: NEGATIVE
Ketones, ur: NEGATIVE mg/dL
Leukocytes,Ua: NEGATIVE
Nitrite: NEGATIVE
Protein, ur: NEGATIVE mg/dL
Specific Gravity, Urine: 1.005 (ref 1.005–1.030)
pH: 5 (ref 5.0–8.0)

## 2020-04-01 LAB — SURGICAL PCR SCREEN
MRSA, PCR: NEGATIVE
Staphylococcus aureus: NEGATIVE

## 2020-04-01 NOTE — Progress Notes (Signed)
PCP - Lucianne Lei, MD Cardiologist - pt denies   Chest x-ray - 04/01/20 EKG - 03/16/20 Stress Test - pt denies ECHO - pt denies Cardiac Cath - pt denies    Blood Thinner Instructions: n/a Aspirin Instructions: n/a  ERAS Protcol - yes  PRE-SURGERY Ensure or G2- Ensure  COVID TEST- 04/07/20   Anesthesia review: n/a  Patient denies shortness of breath, fever, cough and chest pain at PAT appointment   All instructions explained to the patient, with a verbal understanding of the material. Patient agrees to go over the instructions while at home for a better understanding. Patient also instructed to self quarantine after being tested for COVID-19. The opportunity to ask questions was provided.

## 2020-04-03 ENCOUNTER — Other Ambulatory Visit: Payer: Self-pay | Admitting: Orthopaedic Surgery

## 2020-04-03 NOTE — Telephone Encounter (Signed)
Can you please advise since Dr Lorin Mercy is off?

## 2020-04-07 ENCOUNTER — Other Ambulatory Visit (HOSPITAL_COMMUNITY)
Admission: RE | Admit: 2020-04-07 | Discharge: 2020-04-07 | Disposition: A | Discharge status: Home / Self Care | Payer: Medicare Other | Source: Ambulatory Visit | Attending: Orthopaedic Surgery | Admitting: Orthopaedic Surgery

## 2020-04-07 DIAGNOSIS — Z01812 Encounter for preprocedural laboratory examination: Secondary | ICD-10-CM | POA: Insufficient documentation

## 2020-04-07 DIAGNOSIS — Z20822 Contact with and (suspected) exposure to covid-19: Secondary | ICD-10-CM | POA: Diagnosis not present

## 2020-04-07 LAB — SARS CORONAVIRUS 2 (TAT 6-24 HRS): SARS Coronavirus 2: NEGATIVE

## 2020-04-10 ENCOUNTER — Encounter (HOSPITAL_COMMUNITY): Payer: Self-pay | Admitting: Orthopaedic Surgery

## 2020-04-10 ENCOUNTER — Encounter (HOSPITAL_COMMUNITY)
Admission: RE | Disposition: A | Discharge status: Home / Self Care | Payer: Self-pay | Source: Home / Self Care | Attending: Orthopaedic Surgery

## 2020-04-10 ENCOUNTER — Observation Stay (HOSPITAL_COMMUNITY)
Admission: RE | Admit: 2020-04-10 | Discharge: 2020-04-11 | Disposition: A | Discharge status: Home / Self Care | Payer: Medicare Other | Attending: Orthopaedic Surgery | Admitting: Orthopaedic Surgery

## 2020-04-10 ENCOUNTER — Other Ambulatory Visit: Payer: Self-pay

## 2020-04-10 ENCOUNTER — Ambulatory Visit (HOSPITAL_COMMUNITY): Payer: Medicare Other | Admitting: Anesthesiology

## 2020-04-10 ENCOUNTER — Ambulatory Visit (HOSPITAL_COMMUNITY): Payer: Medicare Other

## 2020-04-10 DIAGNOSIS — M48062 Spinal stenosis, lumbar region with neurogenic claudication: Secondary | ICD-10-CM

## 2020-04-10 DIAGNOSIS — I10 Essential (primary) hypertension: Secondary | ICD-10-CM | POA: Insufficient documentation

## 2020-04-10 DIAGNOSIS — M4316 Spondylolisthesis, lumbar region: Secondary | ICD-10-CM | POA: Diagnosis present

## 2020-04-10 DIAGNOSIS — Z9889 Other specified postprocedural states: Secondary | ICD-10-CM | POA: Insufficient documentation

## 2020-04-10 DIAGNOSIS — M47817 Spondylosis without myelopathy or radiculopathy, lumbosacral region: Secondary | ICD-10-CM | POA: Diagnosis not present

## 2020-04-10 DIAGNOSIS — Z419 Encounter for procedure for purposes other than remedying health state, unspecified: Secondary | ICD-10-CM

## 2020-04-10 DIAGNOSIS — M48061 Spinal stenosis, lumbar region without neurogenic claudication: Principal | ICD-10-CM | POA: Diagnosis present

## 2020-04-10 DIAGNOSIS — M47816 Spondylosis without myelopathy or radiculopathy, lumbar region: Secondary | ICD-10-CM | POA: Diagnosis not present

## 2020-04-10 HISTORY — PX: LUMBAR LAMINECTOMY/DECOMPRESSION MICRODISCECTOMY: SHX5026

## 2020-04-10 SURGERY — LUMBAR LAMINECTOMY/DECOMPRESSION MICRODISCECTOMY
Anesthesia: General

## 2020-04-10 MED ORDER — BUPIVACAINE HCL (PF) 0.25 % IJ SOLN: INTRAMUSCULAR | Status: DC | PRN

## 2020-04-10 MED ORDER — BUPIVACAINE HCL (PF) 0.25 % IJ SOLN
INTRAMUSCULAR | Status: AC
  Filled 2020-04-10: qty 30

## 2020-04-10 MED ORDER — ROCURONIUM BROMIDE 100 MG/10ML IV SOLN
INTRAVENOUS | Status: DC | PRN
  Administered 2020-04-10: 50 mg via INTRAVENOUS

## 2020-04-10 MED ORDER — MENTHOL 3 MG MT LOZG: 1.0000 | LOZENGE | OROMUCOSAL | Status: DC | PRN

## 2020-04-10 MED ORDER — LISINOPRIL 20 MG PO TABS: 20.0000 mg | ORAL_TABLET | Freq: Every day | ORAL | Status: DC

## 2020-04-10 MED ORDER — ORAL CARE MOUTH RINSE: 15.0000 mL | Freq: Once | OROMUCOSAL | Status: AC

## 2020-04-10 MED ORDER — SODIUM CHLORIDE 0.9 % IV SOLN: 250.0000 mL | INTRAVENOUS | Status: DC

## 2020-04-10 MED ORDER — FENTANYL CITRATE (PF) 100 MCG/2ML IJ SOLN
INTRAMUSCULAR | Status: DC | PRN
  Administered 2020-04-10: 100 ug via INTRAVENOUS
  Administered 2020-04-10 (×2): 25 ug via INTRAVENOUS

## 2020-04-10 MED ORDER — SODIUM CHLORIDE 0.9 % IV SOLN: INTRAVENOUS | Status: DC

## 2020-04-10 MED ORDER — ONDANSETRON HCL 4 MG/2ML IJ SOLN: 4.0000 mg | Freq: Four times a day (QID) | INTRAMUSCULAR | Status: DC | PRN

## 2020-04-10 MED ORDER — SUGAMMADEX SODIUM 200 MG/2ML IV SOLN
INTRAVENOUS | Status: DC | PRN
  Administered 2020-04-10: 200 mg via INTRAVENOUS

## 2020-04-10 MED ORDER — PROPOFOL 10 MG/ML IV BOLUS
INTRAVENOUS | Status: DC | PRN
  Administered 2020-04-10: 130 mg via INTRAVENOUS

## 2020-04-10 MED ORDER — GABAPENTIN 100 MG PO CAPS: 100.0000 mg | ORAL_CAPSULE | Freq: Three times a day (TID) | ORAL | Status: DC | PRN

## 2020-04-10 MED ORDER — METHOCARBAMOL 1000 MG/10ML IJ SOLN
500.0000 mg | Freq: Four times a day (QID) | INTRAVENOUS | Status: DC | PRN
  Filled 2020-04-10: qty 5

## 2020-04-10 MED ORDER — EPHEDRINE SULFATE 50 MG/ML IJ SOLN
INTRAMUSCULAR | Status: DC | PRN
  Administered 2020-04-10: 5 mg via INTRAVENOUS
  Administered 2020-04-10 (×2): 10 mg via INTRAVENOUS
  Administered 2020-04-10: 5 mg via INTRAVENOUS
  Administered 2020-04-10: 10 mg via INTRAVENOUS

## 2020-04-10 MED ORDER — LISINOPRIL-HYDROCHLOROTHIAZIDE 20-25 MG PO TABS: 1.0000 | ORAL_TABLET | Freq: Every day | ORAL | Status: DC

## 2020-04-10 MED ORDER — PHENOL 1.4 % MT LIQD: 1.0000 | OROMUCOSAL | Status: DC | PRN

## 2020-04-10 MED ORDER — ONDANSETRON HCL 4 MG PO TABS: 4.0000 mg | ORAL_TABLET | Freq: Four times a day (QID) | ORAL | Status: DC | PRN

## 2020-04-10 MED ORDER — HYDROCODONE-ACETAMINOPHEN 7.5-325 MG PO TABS
1.0000 | ORAL_TABLET | ORAL | Status: DC | PRN
  Administered 2020-04-10 (×2): 1 via ORAL
  Filled 2020-04-10 (×3): qty 1

## 2020-04-10 MED ORDER — PHENYLEPHRINE HCL-NACL 10-0.9 MG/250ML-% IV SOLN
INTRAVENOUS | Status: DC | PRN
  Administered 2020-04-10: 40 ug/min via INTRAVENOUS

## 2020-04-10 MED ORDER — ACETAMINOPHEN 325 MG PO TABS
650.0000 mg | ORAL_TABLET | ORAL | Status: DC | PRN
  Administered 2020-04-11: 650 mg via ORAL
  Filled 2020-04-10: qty 2

## 2020-04-10 MED ORDER — PHENYLEPHRINE HCL (PRESSORS) 10 MG/ML IV SOLN
INTRAVENOUS | Status: DC | PRN
  Administered 2020-04-10 (×5): 80 ug via INTRAVENOUS

## 2020-04-10 MED ORDER — CEFAZOLIN SODIUM-DEXTROSE 2-4 GM/100ML-% IV SOLN
2.0000 g | INTRAVENOUS | Status: AC
  Administered 2020-04-10: 2 g via INTRAVENOUS

## 2020-04-10 MED ORDER — SODIUM CHLORIDE 0.9% FLUSH: 3.0000 mL | INTRAVENOUS | Status: DC | PRN

## 2020-04-10 MED ORDER — ONDANSETRON HCL 4 MG/2ML IJ SOLN
INTRAMUSCULAR | Status: DC | PRN
  Administered 2020-04-10: 4 mg via INTRAVENOUS

## 2020-04-10 MED ORDER — HYDROCODONE-ACETAMINOPHEN 7.5-325 MG PO TABS
1.0000 | ORAL_TABLET | Freq: Four times a day (QID) | ORAL | 0 refills | Status: DC | PRN
Start: 2020-04-10 — End: 2020-04-28

## 2020-04-10 MED ORDER — SODIUM CHLORIDE 0.9% FLUSH: 3.0000 mL | Freq: Two times a day (BID) | INTRAVENOUS | Status: DC

## 2020-04-10 MED ORDER — CEFAZOLIN SODIUM-DEXTROSE 2-4 GM/100ML-% IV SOLN
INTRAVENOUS | Status: AC
  Filled 2020-04-10: qty 100

## 2020-04-10 MED ORDER — METHOCARBAMOL 500 MG PO TABS
500.0000 mg | ORAL_TABLET | Freq: Four times a day (QID) | ORAL | Status: DC | PRN
  Administered 2020-04-10 – 2020-04-11 (×3): 500 mg via ORAL
  Filled 2020-04-10 (×2): qty 1

## 2020-04-10 MED ORDER — 0.9 % SODIUM CHLORIDE (POUR BTL) OPTIME
TOPICAL | Status: DC | PRN
  Administered 2020-04-10: 1000 mL

## 2020-04-10 MED ORDER — DEXAMETHASONE SODIUM PHOSPHATE 10 MG/ML IJ SOLN
INTRAMUSCULAR | Status: DC | PRN
  Administered 2020-04-10: 10 mg via INTRAVENOUS

## 2020-04-10 MED ORDER — ACETAMINOPHEN 650 MG RE SUPP: 650.0000 mg | RECTAL | Status: DC | PRN

## 2020-04-10 MED ORDER — FENTANYL CITRATE (PF) 250 MCG/5ML IJ SOLN
INTRAMUSCULAR | Status: AC
  Filled 2020-04-10: qty 5

## 2020-04-10 MED ORDER — FENTANYL CITRATE (PF) 100 MCG/2ML IJ SOLN: 25.0000 ug | INTRAMUSCULAR | Status: DC | PRN

## 2020-04-10 MED ORDER — HYDROMORPHONE HCL 1 MG/ML IJ SOLN: 0.5000 mg | INTRAMUSCULAR | Status: DC | PRN

## 2020-04-10 MED ORDER — LACTATED RINGERS IV SOLN: INTRAVENOUS | Status: DC

## 2020-04-10 MED ORDER — CHLORHEXIDINE GLUCONATE 0.12 % MT SOLN
OROMUCOSAL | Status: AC
  Filled 2020-04-10: qty 15

## 2020-04-10 MED ORDER — CHLORHEXIDINE GLUCONATE 0.12 % MT SOLN
15.0000 mL | Freq: Once | OROMUCOSAL | Status: AC
  Administered 2020-04-10: 15 mL via OROMUCOSAL

## 2020-04-10 MED ORDER — ACETAMINOPHEN 500 MG PO TABS
1000.0000 mg | ORAL_TABLET | Freq: Once | ORAL | Status: AC
  Administered 2020-04-10: 1000 mg via ORAL
  Filled 2020-04-10: qty 2

## 2020-04-10 MED ORDER — HYDROCHLOROTHIAZIDE 25 MG PO TABS: 25.0000 mg | ORAL_TABLET | Freq: Every day | ORAL | Status: DC

## 2020-04-10 MED ORDER — LIDOCAINE HCL (CARDIAC) PF 100 MG/5ML IV SOSY
PREFILLED_SYRINGE | INTRAVENOUS | Status: DC | PRN
  Administered 2020-04-10: 40 mg via INTRAVENOUS

## 2020-04-10 MED ORDER — METHOCARBAMOL 500 MG PO TABS
ORAL_TABLET | ORAL | Status: AC
  Filled 2020-04-10: qty 1

## 2020-04-10 SURGICAL SUPPLY — 44 items
AGENT HMST KT MTR STRL THRMB (HEMOSTASIS) ×1
BUR ROUND FLUTED 4 SOFT TCH (BURR) IMPLANT
CANISTER SUCT 3000ML PPV (MISCELLANEOUS) ×2 IMPLANT
CLSR STERI-STRIP ANTIMIC 1/2X4 (GAUZE/BANDAGES/DRESSINGS) ×2 IMPLANT
COVER SURGICAL LIGHT HANDLE (MISCELLANEOUS) ×2 IMPLANT
COVER WAND RF STERILE (DRAPES) ×2 IMPLANT
DECANTER SPIKE VIAL GLASS SM (MISCELLANEOUS) ×2 IMPLANT
DRAPE HALF SHEET 40X57 (DRAPES) ×4 IMPLANT
DRAPE MICROSCOPE LEICA (MISCELLANEOUS) ×2 IMPLANT
DRAPE SURG 17X23 STRL (DRAPES) ×2 IMPLANT
DRSG MEPILEX BORDER 4X4 (GAUZE/BANDAGES/DRESSINGS) ×2 IMPLANT
DURAPREP 26ML APPLICATOR (WOUND CARE) ×2 IMPLANT
ELECT REM PT RETURN 9FT ADLT (ELECTROSURGICAL) ×2
ELECTRODE REM PT RTRN 9FT ADLT (ELECTROSURGICAL) ×1 IMPLANT
GLOVE BIOGEL PI IND STRL 8 (GLOVE) ×2 IMPLANT
GLOVE BIOGEL PI INDICATOR 8 (GLOVE) ×2
GLOVE ORTHO TXT STRL SZ7.5 (GLOVE) ×4 IMPLANT
GOWN STRL REUS W/ TWL LRG LVL3 (GOWN DISPOSABLE) ×2 IMPLANT
GOWN STRL REUS W/ TWL XL LVL3 (GOWN DISPOSABLE) ×1 IMPLANT
GOWN STRL REUS W/TWL 2XL LVL3 (GOWN DISPOSABLE) ×2 IMPLANT
GOWN STRL REUS W/TWL LRG LVL3 (GOWN DISPOSABLE) ×4
GOWN STRL REUS W/TWL XL LVL3 (GOWN DISPOSABLE) ×2
KIT BASIN OR (CUSTOM PROCEDURE TRAY) ×2 IMPLANT
KIT TURNOVER KIT B (KITS) ×2 IMPLANT
MANIFOLD NEPTUNE II (INSTRUMENTS) ×2 IMPLANT
NDL HYPO 25GX1X1/2 BEV (NEEDLE) ×1 IMPLANT
NDL SPNL 18GX3.5 QUINCKE PK (NEEDLE) ×1 IMPLANT
NEEDLE HYPO 25GX1X1/2 BEV (NEEDLE) ×2 IMPLANT
NEEDLE SPNL 18GX3.5 QUINCKE PK (NEEDLE) ×2 IMPLANT
NS IRRIG 1000ML POUR BTL (IV SOLUTION) ×2 IMPLANT
PACK LAMINECTOMY ORTHO (CUSTOM PROCEDURE TRAY) ×2 IMPLANT
PAD ARMBOARD 7.5X6 YLW CONV (MISCELLANEOUS) ×4 IMPLANT
PATTIES SURGICAL .5 X.5 (GAUZE/BANDAGES/DRESSINGS) IMPLANT
PATTIES SURGICAL .75X.75 (GAUZE/BANDAGES/DRESSINGS) IMPLANT
SURGIFLO W/THROMBIN 8M KIT (HEMOSTASIS) ×1 IMPLANT
SUT VIC AB 0 CT1 27 (SUTURE)
SUT VIC AB 0 CT1 27XBRD ANBCTR (SUTURE) IMPLANT
SUT VIC AB 1 CTX 36 (SUTURE) ×2
SUT VIC AB 1 CTX36XBRD ANBCTR (SUTURE) ×1 IMPLANT
SUT VIC AB 2-0 CT1 27 (SUTURE) ×2
SUT VIC AB 2-0 CT1 TAPERPNT 27 (SUTURE) ×1 IMPLANT
SUT VIC AB 3-0 X1 27 (SUTURE) ×2 IMPLANT
TOWEL GREEN STERILE (TOWEL DISPOSABLE) ×2 IMPLANT
TOWEL GREEN STERILE FF (TOWEL DISPOSABLE) ×2 IMPLANT

## 2020-04-10 NOTE — Op Note (Addendum)
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  Procedure: L4 and L5 laminectomy for decompression severe spinal stenosis with spondylolithesis, microscope assisted.  Surgeon: Rodell Perna, MD  Assistant: Benjiman Core, PA-C medically necessary and present for the entire procedure  Anesthesia General orotracheal plus Marcaine skin local  EBL 100 cc  Procedure: Abduction general anesthesia orotracheal intubation Ancef prophylaxis patient was placed prone on chest rolls arms at 9090 roll pads underneath the shoulders and yellow pads underneath the ulnar nerve at each elbow.  1015 drape was applied at the S1 level back was prepped with DuraPrep squared with towel sterile skin marker and Betadine Steri-Drape applied with laminectomy sheets and drapes.  Timeout procedure was completed Ancef was given prophylactically.  Spinal needles were placed just above and below the planned level decompression based on palpation of the iliac crest.  After initial film needle was adjusted and final permanent film was obtained.  Midline incision was made subperiosteal dissection up to the lamina.  Self-retaining McCullough retractor was placed and then 2 Coker clamps were placed above and below the areas for planned decompression.  Due to patient's spondylolisthesis we had to take L5 lamina all the way down to the disc at L5-S1 and do complete laminectomy at L4.  Once crosstable lateral x-ray was confirmed posterior elements were removed lamina was thinned with burst and then using osteotomes along the lateral gutter Kerrison rongeurs were used with patties used to protect the dura half by half and 1 cm a 1 cm patties.  Surgi-Flo was used in the gutters.  Bipolar cautery was used as necessary.  There was hypertrophic ligamentum present.  Overhanging facet spurs almost to the midline which had to be removed.  There are was carefully protected was intact throughout the case.  Continued  decompression tear until the dural tube had rounded up to its normal position except at the level of the disc space L4-5 with even removal of hypertrophic ligaments and spurs the dural tube still appeared to be slightly narrow although there was no compression.  Copious irrigation gutters were run again using the [place] and with [facility] to feel and remove any remaining spurs or chunks of ligament.  Good decompression all gutters were checked no no bleeding Surgi-Flo was evacuated copious irrigation and standard layered closure #1 Vicryl in the fascia 2-0 Vicryl subtendinous tissue skin staple closure postop dressing and transferred to the recovery room.  Patient tolerated the procedure well.

## 2020-04-10 NOTE — Transfer of Care (Signed)
Immediate Anesthesia Transfer of Care Note  Patient: Courtney Perkins  Procedure(s) Performed: L4-5 LUMBAR DECOMPRESSION (N/A )  Patient Location: PACU  Anesthesia Type:General  Level of Consciousness: drowsy  Airway & Oxygen Therapy: Patient Spontanous Breathing and Patient connected to nasal cannula oxygen  Post-op Assessment: Report given to RN, Post -op Vital signs reviewed and stable and Patient moving all extremities  Post vital signs: Reviewed and stable  Last Vitals:  Vitals Value Taken Time  BP 134/62 04/10/20 1210  Temp 36.4 C 04/10/20 1210  Pulse 75 04/10/20 1211  Resp 13 04/10/20 1211  SpO2 100 % 04/10/20 1211  Vitals shown include unvalidated device data.  Last Pain:  Vitals:   04/10/20 1210  PainSc: 3          Complications: No complications documented.

## 2020-04-10 NOTE — Anesthesia Procedure Notes (Signed)
Procedure Name: Intubation Date/Time: 04/10/2020 10:18 AM Performed by: Shamiracle Gorden T, CRNA Pre-anesthesia Checklist: Patient identified, Emergency Drugs available, Suction available and Patient being monitored Patient Re-evaluated:Patient Re-evaluated prior to induction Oxygen Delivery Method: Circle system utilized Preoxygenation: Pre-oxygenation with 100% oxygen Induction Type: IV induction Ventilation: Mask ventilation without difficulty Grade View: Grade II Tube type: Oral Tube size: 7.5 mm Number of attempts: 1 Airway Equipment and Method: Stylet and Oral airway Placement Confirmation: ETT inserted through vocal cords under direct vision,  positive ETCO2 and breath sounds checked- equal and bilateral Secured at: 21 cm Tube secured with: Tape Dental Injury: Teeth and Oropharynx as per pre-operative assessment

## 2020-04-10 NOTE — Interval H&P Note (Signed)
**Note Courtney-Identified via Obfuscation** History and Physical Interval Note:  04/10/2020 9:26 AM  Courtney Perkins  has presented today for surgery, with the diagnosis of L4-5 stenosis.  The various methods of treatment have been discussed with the patient and family. After consideration of risks, benefits and other options for treatment, the patient has consented to  Procedure(s): L4-5 LUMBAR DECOMPRESSION (N/A) as a surgical intervention.  The patient's history has been reviewed, patient examined, no change in status, stable for surgery.  I have reviewed the patient's chart and labs.  Questions were answered to the patient's satisfaction.     Courtney Perkins

## 2020-04-10 NOTE — Anesthesia Postprocedure Evaluation (Signed)
Anesthesia Post Note  Patient: Courtney Perkins  Procedure(s) Performed: L4-5 LUMBAR DECOMPRESSION (N/A )     Patient location during evaluation: PACU Anesthesia Type: General Level of consciousness: awake and alert, oriented and patient cooperative Pain management: pain level controlled Vital Signs Assessment: post-procedure vital signs reviewed and stable Respiratory status: spontaneous breathing, nonlabored ventilation and respiratory function stable Cardiovascular status: blood pressure returned to baseline and stable Postop Assessment: no apparent nausea or vomiting Anesthetic complications: no   No complications documented.  Last Vitals:  Vitals:   04/10/20 1250 04/10/20 1310  BP: (!) 108/47   Pulse: 66   Resp: 16   Temp:  (!) 36.1 C  SpO2: 99%     Last Pain:  Vitals:   04/10/20 1310  PainSc: [place]

## 2020-04-10 NOTE — Discharge Instructions (Addendum)
Ok to shower 5 days postop.  No tub soaking  Do not apply any creams or ointments to incision.    Staples will be removed 2 weeks postop.   No aggressive activity.  No bending, squatting or prolonged sitting.  Mostly be in reclined position or lying down.    Call office if there are any questions or concerns.   Call Your Doctor If Any of These Occur  Redness, drainage, or swelling at the wound.   Temperature greater than 101 degrees.  Severe pain not relieved by pain medication.  Incision starts to come apart.

## 2020-04-10 NOTE — Evaluation (Addendum)
I agree with the following treatment note after review of the documentation. This session was performed under the supervision of a licensed clinician.   Lou Miner, DPT  Acute Rehabilitation Services  Pager: [phone]   Physical Therapy Evaluation & Discharge Patient Details Name: Courtney Perkins MRN: [id number] DOB: 03/06/1941 Today's Date: 04/10/2020   History of Present Illness  pt is a 79 yo female who is s/p L4-5 decompression spinal surgery. Pt has a PMH of HTN.  Clinical Impression  Pt was evaluated for the above diagnosis and impairments below. Pt required supervision to min assist for all mobility tasks assessed. Pt will be returning home at d/c but will have the necessary assistance from family. She was educated on spinal precautions and generalized walking program. Pt completed all necessary functional tasks and feel that she does not require any further acute therapy at this time. Will sign off on this patient. If needs change, please reconsult.     Follow Up Recommendations No PT follow up    Equipment Recommendations  None recommended by PT    Recommendations for Other Services       Precautions / Restrictions Precautions Precautions: Back Precaution Booklet Issued: Yes (comment) Precaution Comments: pt edcuated on spinal precautions Restrictions Weight Bearing Restrictions: No      Mobility  Bed Mobility Overal bed mobility: Needs Assistance Bed Mobility: Rolling;Sidelying to Sit;Sit to Sidelying Rolling: Min assist Sidelying to sit: Min assist;HOB elevated     Sit to sidelying: Min assist;HOB elevated General bed mobility comments: pt required min assist for lifting BLE off the EOB and max verbal cuing for technique using log roll technique. pt was hesistant on getting down onto R side for return sidelying  Transfers Overall transfer level: Needs assistance Equipment used: Rolling walker (2 wheeled) Transfers: Sit to/from Stand Sit to Stand:  Supervision         General transfer comment: pt required supervision assist for safety with RW. Pt required verbal cues for hand placement for transfer with RW  Ambulation/Gait Ambulation/Gait assistance: Min guard Gait Distance (Feet): 200 Feet Assistive device: Rolling walker (2 wheeled) Gait Pattern/deviations: Step-through pattern;Decreased stride length Gait velocity: decreased   General Gait Details: pt was slow, guarded with gait. She required max multimodal cues for RW sequencing and proximity to device and for shoulder relaxation with RW.   Stairs Stairs: Yes Stairs assistance: Min assist;Min guard Stair Management: One rail Right;Step to pattern;Sideways;Forwards Number of Stairs: 4 General stair comments: pt required min assist with railing on R and HHA on LUE for forward stair navigation. Pt descended stairs facing sideways and required tactile cuing for hand placement assistance.   Wheelchair Mobility    Modified Rankin (Stroke Patients Only)       Balance Overall balance assessment: Needs assistance Sitting-balance support: No upper extremity supported;Feet supported Sitting balance-Leahy Scale: Fair Sitting balance - Comments: pt able to sit EOB without UE support   Standing balance support: Bilateral upper extremity supported;During functional activity Standing balance-Leahy Scale: Poor Standing balance comment: pt reliant on RW for support            Pertinent Vitals/Pain Pain Assessment: No/denies pain    Home Living Family/patient expects to be discharged to:: Private residence Living Arrangements: Alone Available Help at Discharge: Family;Available 24 hours/day Type of Home: House Home Access: Stairs to enter Entrance Stairs-Rails: Can reach both Entrance Stairs-Number of Steps: 3 Home Layout: One level Home Equipment: Walker - 2 wheels;Cane - single point;Shower seat -  built in;Grab bars - tub/shower;Grab bars - toilet;Toilet  riser Additional Comments: pt will have daughter and brother living with her to provide necessary assistance    Prior Function Level of Independence: Independent with assistive device(s)         Comments: pt ambulates with RW and sometimes will use a cane      Hand Dominance        Extremity/Trunk Assessment   Upper Extremity Assessment Upper Extremity Assessment: Overall WFL for tasks assessed    Lower Extremity Assessment Lower Extremity Assessment: Generalized weakness    Cervical / Trunk Assessment Cervical / Trunk Assessment: Normal  Communication   Communication: No difficulties  Cognition Arousal/Alertness: Awake/alert Behavior During Therapy: WFL for tasks assessed/performed Overall Cognitive Status: Within Functional Limits for tasks assessed         General Comments General comments (skin integrity, edema, etc.): pt was educated on walking program and car transfer technique     Exercises     Assessment/Plan    PT Assessment Patent does not need any further PT services  PT Problem List         PT Treatment Interventions      PT Goals (Current goals can be found in the Care Plan section)  Acute Rehab PT Goals Patient Stated Goal: get home tomorrow PT Goal Formulation: With patient/family Time For Goal Achievement: 04/10/20 Potential to Achieve Goals: Good    Frequency     Barriers to discharge        Co-evaluation               AM-PAC PT "6 Clicks" Mobility  Outcome Measure Help needed turning from your back to your side while in a flat bed without using bedrails?: A Little Help needed moving from lying on your back to sitting on the side of a flat bed without using bedrails?: A Little Help needed moving to and from a bed to a chair (including a wheelchair)?: None Help needed standing up from a chair using your arms (e.g., wheelchair or bedside chair)?: None Help needed to walk in hospital room?: A Little Help needed climbing 3-5  steps with a railing? : A Little 6 Click Score: 20    End of Session Equipment Utilized During Treatment: Gait belt Activity Tolerance: Patient tolerated treatment well Patient left: in bed;with call bell/phone within reach;with family/visitor present Nurse Communication: Mobility status PT Visit Diagnosis: Difficulty in walking, not elsewhere classified (R26.2);Muscle weakness (generalized) (M62.81)    Time: 2563-8937 PT Time Calculation (min) (ACUTE ONLY): 24 min   Charges:   PT Evaluation $PT Eval Low Complexity: 1 Low PT Treatments $Gait Training: 8-22 mins        Gloriann Loan, SPT  Acute Rehabilitation Services  Office: [phone]  04/10/2020, 5:57 PM

## 2020-04-10 NOTE — Anesthesia Preprocedure Evaluation (Addendum)
Anesthesia Evaluation  Patient identified by MRN, date of birth, ID band Patient awake    Reviewed: Allergy & Precautions, H&P , NPO status , Patient's Chart, lab work & pertinent test results  Airway Mallampati: II  TM Distance: >3 FB Neck ROM: Full    Dental no notable dental hx.    Pulmonary neg pulmonary ROS,    Pulmonary exam normal breath sounds clear to auscultation       Cardiovascular hypertension, Pt. 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    Abdominal   Peds  Hematology negative hematology ROS (+)   Anesthesia Other Findings   Reproductive/Obstetrics negative OB ROS                            Anesthesia Physical Anesthesia Plan  ASA: II  Anesthesia Plan: General   Post-op Pain Management:    Induction: Intravenous  PONV Risk Score and Plan: 4 or greater and Ondansetron, Dexamethasone and Midazolam  Airway Management Planned: Oral ETT  Additional Equipment:   Intra-op Plan:   Post-operative Plan: Extubation in OR  Informed Consent: I have reviewed the patients History and Physical, chart, labs and discussed the procedure including the risks, benefits and alternatives for the proposed anesthesia with the patient or authorized representative who has indicated his/her understanding and acceptance.     Dental advisory given  Plan Discussed with: CRNA  Anesthesia Plan Comments:         Anesthesia Quick Evaluation

## 2020-04-10 NOTE — H&P (Signed)
Courtney Perkins is an 79 y.o. female.   Chief Complaint: back pain and LE radiculopathy HPI: 79 year old female with history of L4-5 stenosis, back pain and lower extremity radiculopathy comes in for preop evaluation. States that symptoms unchanged from previous visit. She is wanting to proceed with L4-5 decompression as scheduled  Past Medical History:  Diagnosis Date  . Anxiety   . Back ache   . Hypertension     Past Surgical History:  Procedure Laterality Date  . HIP SURGERY Right   . OPERATIVE HYSTEROSCOPY      No family history on file. Social History:  reports that she has never smoked. She has never used smokeless tobacco. She reports previous alcohol use. She reports previous drug use.  Allergies: No Known Allergies  No medications prior to admission.    No results found for this or any previous visit (from the past 48 hour(s)). No results found.  Review of Systems  Constitutional: Positive for activity change.  HENT: Negative.   Respiratory: Negative.   Cardiovascular: Negative.   Genitourinary: Negative.   Musculoskeletal: Positive for back pain.  Skin: Negative.   Neurological: Positive for numbness.  Psychiatric/Behavioral: Negative.     There were no vitals taken for this visit. Physical Exam HENT:     Head: Normocephalic and atraumatic.     Nose: Nose normal.  Cardiovascular:     Rate and Rhythm: Normal rate.     Heart sounds: Normal heart sounds.  Pulmonary:     Effort: Pulmonary effort is normal. No respiratory distress.     Breath sounds: Normal breath sounds.  Musculoskeletal:        General: Tenderness present.     Cervical back: Normal range of motion.  Neurological:     General: No focal deficit present.     Mental Status: She is alert and oriented to person, place, and time.      Assessment/Plan L4-5 stenosis  Will proceed with L4-5 decompression as scheduled.  Surgical procedure discussed and all questions answered.    Benjiman Core, PA-C 04/10/2020, 7:29 AM

## 2020-04-11 ENCOUNTER — Encounter (HOSPITAL_COMMUNITY): Payer: Self-pay | Admitting: Orthopaedic Surgery

## 2020-04-11 DIAGNOSIS — I10 Essential (primary) hypertension: Secondary | ICD-10-CM | POA: Diagnosis not present

## 2020-04-11 DIAGNOSIS — M48061 Spinal stenosis, lumbar region without neurogenic claudication: Secondary | ICD-10-CM | POA: Diagnosis not present

## 2020-04-11 DIAGNOSIS — M4316 Spondylolisthesis, lumbar region: Secondary | ICD-10-CM | POA: Diagnosis not present

## 2020-04-11 NOTE — Evaluation (Signed)
Occupational Therapy Evaluation Patient Details Name: Courtney Perkins MRN: [id number] DOB: 01/15/1941 Today's Date: 04/11/2020    History of Present Illness pt is a 79 yo female who is s/p L4-5 decompression spinal surgery. Pt has a PMH of HTN.   Clinical Impression   This 79 y/o female presents with the above. PTA pt completing ADL and mobility tasks with mod independence using RW/SPC. Pt very pleasant and wiling to participate in therapy session. She currently requires grossly minguard assist for functional transfers using RW, up to [facility] for LB ADL given need to adhere to back precautions. Pt requiring max cues for recall of back precautions start of session, mod verbal cues for adhering to precautions during session completion. Pt's daughter present and with good recall/understanding of precautions, reports plans to return home with pt and can assist with ADL/iADL PRN. Educated/reviewed with pt/pt's daughter re: back precautions, AE, safety and compensatory techniques for completing ADL and functional transfers while maintaining precautions with questions answered throughout. Pt anticipating d/c home today.     Follow Up Recommendations  No OT follow up;Supervision/Assistance - 24 hour    Equipment Recommendations  None recommended by OT (pt's DME needs are met )           Precautions / Restrictions Precautions Precautions: Back Precaution Booklet Issued: Yes (comment) Precaution Comments: pt edcuated on spinal precautions Restrictions Weight Bearing Restrictions: No      Mobility Bed Mobility               General bed mobility comments: seated EOB upon arrival   Transfers Overall transfer level: Needs assistance Equipment used: Rolling walker (2 wheeled) Transfers: Sit to/from Stand Sit to Stand: Supervision         General transfer comment: for balance and safety, VCs for safe hand placement, stood x2 from EOB and x1 from toilet     Balance Overall balance  assessment: Needs assistance Sitting-balance support: No upper extremity supported;Feet supported Sitting balance-Leahy Scale: Good     Standing balance support: Bilateral upper extremity supported;During functional activity Standing balance-Leahy Scale: Fair Standing balance comment: able to statically stand for ADL tasks with minguard for safety                           ADL either performed or assessed with clinical judgement   ADL Overall ADL's : Needs assistance/impaired Eating/Feeding: Modified independent;Sitting   Grooming: Supervision/safety;Standing;Wash/dry hands   Upper Body Bathing: Supervision/ safety;Sitting   Lower Body Bathing: Minimal assistance;Sit to/from stand;Cueing for back precautions   Upper Body Dressing : Set up;Sitting Upper Body Dressing Details (indicate cue type and reason): donning overhead shirt/dress  Lower Body Dressing: Moderate assistance;Sit to/from stand Lower Body Dressing Details (indicate cue type and reason): educated in use of AD for LB ADL including sock aide and reacher; pt's daughter present and reports also can assist  Toilet Transfer: Min guard;Ambulation;RW;Regular Toilet;Grab bars Toilet Transfer Details (indicate cue type and reason): cues for hand placement with transitions  Toileting- Clothing Manipulation and Hygiene: Min guard;Sitting/lateral lean;Sit to/from Nurse, children's Details (indicate cue type and reason): discussed safe transfer techniques, educated/recommended pt use shower seat initially for added safety, pt/pt's daughter verbalizing understanding  Functional mobility during ADLs: Min guard;Rolling walker       Pertinent Vitals/Pain Pain Assessment: Faces Faces Pain Scale: Hurts a little bit Pain Location: general soreness at incision site  Pain Descriptors / Indicators: Discomfort;Sore  Pain Intervention(s): Limited activity within patient's tolerance;Monitored during  session;Repositioned     Hand Dominance     Extremity/Trunk Assessment Upper Extremity Assessment Upper Extremity Assessment: Overall WFL for tasks assessed   Lower Extremity Assessment Lower Extremity Assessment: Defer to PT evaluation       Communication Communication Communication: No difficulties   Cognition Arousal/Alertness: Awake/alert Behavior During Therapy: WFL for tasks assessed/performed Overall Cognitive Status: Impaired/Different from baseline Area of Impairment: Memory                     Memory: Decreased recall of precautions;Decreased short-term memory         General Comments: requires cues to recall back precautions    General Comments       Exercises     Shoulder Instructions      Home Living Family/patient expects to be discharged to:: Private residence Living Arrangements: Alone Available Help at Discharge: Family;Available 24 hours/day Type of Home: House Home Access: Stairs to enter [employer] of Steps: 3 Entrance Stairs-Rails: Can reach both Home Layout: One level     Bathroom Shower/Tub: Occupational psychologist: Standard Bathroom Accessibility: Yes   Home Equipment: Environmental consultant - 2 wheels;Cane - single point;Shower seat - built in;Grab bars - tub/shower;Grab bars - toilet;Toilet riser   Additional Comments: pt will have daughter and son living with her to provide necessary assistance      Prior Functioning/Environment Level of Independence: Independent with assistive device(s)        Comments: pt ambulates with RW and sometimes will use a cane         OT Problem List: Decreased strength;Decreased range of motion;Impaired balance (sitting and/or standing);Decreased activity tolerance;Decreased knowledge of use of DME or AE;Decreased knowledge of precautions;Pain      OT Treatment/Interventions: Self-care/ADL training;Therapeutic exercise;Patient/family education;Balance training;Therapeutic  activities;DME and/or AE instruction    OT Goals(Current goals can be found in the care plan section) Acute Rehab OT Goals Patient Stated Goal: home today OT Goal Formulation: With patient Time For Goal Achievement: 04/25/20 Potential to Achieve Goals: Good  OT Frequency: Min 2X/week   Barriers to D/C:            Co-evaluation              AM-PAC OT "6 Clicks" Daily Activity     Outcome Measure Help from another person eating meals?: None Help from another person taking care of personal grooming?: A Little Help from another person toileting, which includes using toliet, bedpan, or urinal?: A Little Help from another person bathing (including washing, rinsing, drying)?: A Little Help from another person to put on and taking off regular upper body clothing?: A Little Help from another person to put on and taking off regular lower body clothing?: A Lot 6 Click Score: 18   End of Session Equipment Utilized During Treatment: Rolling walker Nurse Communication: Mobility status  Activity Tolerance: Patient tolerated treatment well Patient left: in bed;with call bell/phone within reach;with family/visitor present  OT Visit Diagnosis: Other abnormalities of gait and mobility (R26.89);Muscle weakness (generalized) (M62.81)                Time: 1660-6301 OT Time Calculation (min): 29 min Charges:  OT General Charges $OT Visit: 1 Visit OT Evaluation $OT Eval Low Complexity: 1 Low OT Treatments $Self Care/Home Management : 8-22 mins  Lou Cal, OT Acute Rehabilitation Services Pager [phone] Office [phone]  Raymondo Band 04/11/2020, 9:58 AM

## 2020-04-11 NOTE — Progress Notes (Signed)
Subjective: 1 Day Post-Op Procedure(s) (LRB): L4-5 LUMBAR DECOMPRESSION (N/A) Patient reports pain as moderate.    Objective: Vital signs in last 24 hours: Temp:  [97 F (36.1 C)-98 F (36.7 C)] 97.8 F (36.6 C) (08/28 0742) Pulse Rate:  [63-91] 91 (08/28 0742) Resp:  [11-20] 18 (08/28 0742) BP: (108-134)/(47-63) 126/63 (08/28 0742) SpO2:  [97 %-100 %] 100 % (08/28 0742)  Intake/Output from previous day: 08/27 0701 - 08/28 0700 In: 1100 [I.V.:1000; IV Piggyback:100] Out: 50 [Blood:50] Intake/Output this shift: No intake/output data recorded.  No results for input(s): HGB in the last 72 hours. No results for input(s): WBC, RBC, HCT, PLT in the last 72 hours. No results for input(s): NA, K, CL, CO2, BUN, CREATININE, GLUCOSE, CALCIUM in the last 72 hours. No results for input(s): LABPT, INR in the last 72 hours.  Sensation intact distally Intact pulses distally Dorsiflexion/Plantar flexion intact Incision: scant drainage   Assessment/Plan: 1 Day Post-Op Procedure(s) (LRB): L4-5 LUMBAR DECOMPRESSION (N/A) Discharge to home    Mcarthur Rossetti 04/11/2020, 9:55 AM

## 2020-04-11 NOTE — Plan of Care (Signed)
Patient alert and oriented, mae's well, voiding adequate amount of urine, swallowing without difficulty, no c/o pain at time of discharge. Patient discharged home with family. Script and discharged instructions given to patient. Patient and family stated understanding of instructions given. Patient has an appointment with Dr. Yates  

## 2020-04-13 ENCOUNTER — Telehealth: Payer: Self-pay | Admitting: Orthopaedic Surgery

## 2020-04-13 NOTE — Discharge Summary (Signed)
Patient ID: Courtney Perkins MRN: [id number] DOB/AGE: Dec 20, 1940 79 y.o.  Admit date: 04/10/2020 Discharge date: 04/11/2020 Admission Diagnoses:  Active Problems:   Spondylolisthesis, lumbar region   Spinal stenosis of lumbar region   Discharge Diagnoses:  Active Problems:   Spondylolisthesis, lumbar region   Spinal stenosis of lumbar region  status post Procedure(s): L4-5 LUMBAR DECOMPRESSION  Past Medical History:  Diagnosis Date  . Anxiety   . Back ache   . Hypertension     Surgeries: Procedure(s): L4-5 LUMBAR DECOMPRESSION on 04/10/2020   Consultants:   Discharged Condition: Improved  Hospital Course: Courtney Perkins is an 79 y.o. female who was admitted 04/10/2020 for operative treatment of lumbar stenosis. Patient failed conservative treatments (please see the history and physical for the specifics) and had severe unremitting pain that affects sleep, daily activities and work/hobbies. After pre-op clearance, the patient was taken to the operating room on 04/10/2020 and underwent  Procedure(s): L4-5 LUMBAR DECOMPRESSION.    Patient was given perioperative antibiotics:  Anti-infectives (From admission, onward)   Start     Dose/Rate Route Frequency Ordered Stop   04/10/20 0930  ceFAZolin (ANCEF) IVPB 2g/100 mL premix        2 g 200 mL/hr over 30 Minutes Intravenous On call to O.R. 04/10/20 0917 04/10/20 1019   04/10/20 0916  ceFAZolin (ANCEF) 2-4 GM/100ML-% IVPB       Note to Pharmacy: Odelia Gage   : cabinet override      04/10/20 0916 04/10/20 1040       Patient was given sequential compression devices and early ambulation to prevent DVT.   Patient benefited maximally from hospital stay and there were no complications. At the time of discharge, the patient was urinating/moving their bowels without difficulty, tolerating a regular diet, pain is controlled with oral pain medications and they have been cleared by PT/OT.   Recent vital signs: No data found.    Recent laboratory studies: No results for input(s): WBC, HGB, HCT, PLT, NA, K, CL, CO2, BUN, CREATININE, GLUCOSE, INR, CALCIUM in the last 72 hours.  Invalid input(s): PT, 2   Discharge Medications:   Allergies as of 04/11/2020   No Known Allergies     Medication List    STOP taking these medications   BC FAST PAIN RELIEF ARTHRITIS PO   traMADol 50 MG tablet Commonly known as: ULTRAM     TAKE these medications   CENTRUM ADULTS PO Take 1 tablet by mouth daily.   gabapentin 100 MG capsule Commonly known as: NEURONTIN Take 1 capsule (100 mg total) by mouth 3 (three) times daily. What changed:   when to take this  reasons to take this   HYDROcodone-acetaminophen 7.5-325 MG tablet Commonly known as: Norco Take 1 tablet by mouth every 6 (six) hours as needed for moderate pain.   lisinopril-hydrochlorothiazide 20-25 MG tablet Commonly known as: ZESTORETIC Take 1 tablet by mouth daily.       Diagnostic Studies: DG Chest 2 View  Result Date: 04/02/2020 CLINICAL DATA:  Preoperative exam for spine surgery. Degenerative disc disease EXAM: CHEST - 2 VIEW COMPARISON:  12/28/2009 FINDINGS: The heart size and mediastinal contours are within normal limits. Both lungs are clear. The visualized skeletal structures are unremarkable. IMPRESSION: No active cardiopulmonary disease. Electronically Signed   By: Davina Poke D.O.   On: 04/02/2020 12:06   DG Lumbar Spine 2-3 Views  Result Date: 04/10/2020 CLINICAL DATA:  Localization images for laminectomy EXAM: LUMBAR SPINE - 2-3  VIEW COMPARISON:  January 28, 2020 lumbar radiograph; lumbar MRI January 23, 2020 FINDINGS: On lateral lumbar image labeled #1, there is a metallic probe with tip posterior to the mid L4 vertebral body with a second probe with tip posterior to the mid L5 vertebral body. There is 9 mm of anterolisthesis of L4 on L5. No other spondylolisthesis. There is stable anterior wedging of the L1 vertebral body. There is vacuum  phenomenon at L3-4, L4-5, and L5-S1. On lateral lumbar image labeled #2, metallic probe tip is posterior to the L3-4 level. A second metallic probe tip is posterior to the inferior aspect of the L5 vertebral body. The 9 mm of anterolisthesis of L4 on L5 persists without change. No new spondylolisthesis. Anterior wedging of L1 is stable. Severe disc space narrowing at multiple levels with vacuum phenomenon at L3-4, L4-5, and L5-S1 noted. IMPRESSION: On final submitted cross-table lateral lumbar image, metallic probe tips are posterior to the L3-4 and inferior L5 levels respectively. There is 9 mm of anterolisthesis of L4 on L5. No other spondylolisthesis. There is moderate anterior wedging of L1, stable. No new fracture. 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 Electronically Signed   By: Lowella Grip III M.D.   On: 04/10/2020 13:29    Discharge Instructions    Incentive spirometry RT   Complete by: As directed        Follow-up Information    Schedule an appointment as soon as possible for a visit with Marybelle Killings, MD.   Specialty: Orthopedic Surgery Why: need return office visit one week postop Contact information: [place] [address] [phone]               Discharge Plan:  discharge to home  Disposition:     Signed: Benjiman Core  04/13/2020, 10:17 AM

## 2020-04-13 NOTE — Telephone Encounter (Signed)
Forms have been updated and sent in by Tammy. I called and advised.

## 2020-04-13 NOTE — Telephone Encounter (Signed)
Pts son Juanda Crumble called stating he filed FMLA paperwork at the beginning of August and it's set to end on 04/14/20, however the pt had surgery 04/10/20 and so now he would like an extension on the paperwork and would like to know what he needs to do to get this done.  [phone]

## 2020-04-21 ENCOUNTER — Ambulatory Visit (INDEPENDENT_AMBULATORY_CARE_PROVIDER_SITE_OTHER): Payer: Medicare Other | Admitting: Orthopaedic Surgery

## 2020-04-21 ENCOUNTER — Encounter: Payer: Self-pay | Admitting: Orthopaedic Surgery

## 2020-04-21 VITALS — Ht 66.0 in | Wt 143.0 lb

## 2020-04-21 DIAGNOSIS — Z9889 Other specified postprocedural states: Secondary | ICD-10-CM

## 2020-04-21 NOTE — Progress Notes (Signed)
   Post-Op Visit Note   Patient: Courtney Perkins           Date of Birth: Jul 27, 1941           MRN: [id number] Visit Date: 04/21/2020 PCP: Lucianne Lei, MD   Assessment & Plan: Post lumbar decompression L4-5.  She states she has had minimal pain incision looks good she will return in 1 week for staple removal and Steri-Strip application she is very happy with the surgical result.  Chief Complaint:  Chief Complaint  Patient presents with  . Lower Back - Routine Post Op   Visit Diagnoses:  1. History of lumbar laminectomy for spinal cord decompression     Plan: Return in 1 week for staple removal should continue walking with her walker I discussed with her eventually she should be able to wean from the walker to a cane and then off the cane as her lumbar incision heals.  Follow-Up Instructions: No follow-ups on file.   Orders:  No orders of the defined types were placed in this encounter.  No orders of the defined types were placed in this encounter.   Imaging: No results found.  PMFS History: Patient Active Problem List   Diagnosis Date Noted  . History of lumbar laminectomy for spinal cord decompression 04/10/2020  . Pre-op evaluation 03/16/2020  . Essential hypertension 03/16/2020  . Abnormal EKG 03/16/2020  . Hx of total hip arthroplasty, right 07/09/2019  . Spondylolisthesis, lumbar region 07/09/2019   Past Medical History:  Diagnosis Date  . Anxiety   . Back ache   . Hypertension     No family history on file.  Past Surgical History:  Procedure Laterality Date  . HIP SURGERY Right   . LUMBAR LAMINECTOMY/DECOMPRESSION MICRODISCECTOMY N/A 04/10/2020   Procedure: L4-5 LUMBAR DECOMPRESSION;  Surgeon: Marybelle Killings, MD;  Location: [facility];  Service: Orthopedics;  Laterality: N/A;  . OPERATIVE HYSTEROSCOPY     Social History   Occupational History  . Not on file  Tobacco Use  . Smoking status: Never Smoker  . Smokeless tobacco: Never Used  Vaping Use  .  Vaping Use: Never used  Substance and Sexual Activity  . Alcohol use: Not Currently  . Drug use: Not Currently  . Sexual activity: Not on file

## 2020-04-28 ENCOUNTER — Encounter: Payer: Self-pay | Admitting: Orthopaedic Surgery

## 2020-04-28 ENCOUNTER — Ambulatory Visit (INDEPENDENT_AMBULATORY_CARE_PROVIDER_SITE_OTHER): Payer: Medicare Other | Admitting: Orthopaedic Surgery

## 2020-04-28 VITALS — Ht 66.0 in | Wt 143.0 lb

## 2020-04-28 DIAGNOSIS — Z9889 Other specified postprocedural states: Secondary | ICD-10-CM

## 2020-04-28 MED ORDER — HYDROCODONE-ACETAMINOPHEN 5-325 MG PO TABS: 1.0000 | ORAL_TABLET | Freq: Four times a day (QID) | ORAL | 0 refills | Status: AC | PRN

## 2020-04-28 NOTE — Progress Notes (Signed)
   Post-Op Visit Note   Patient: Courtney Perkins           Date of Birth: [birth year]           MRN: [id number] Visit Date: 04/28/2020 PCP: Lucianne Lei, MD   Assessment & Plan: Patient is off pain medication she is happy with the surgical result.  Staples are harvested today.  Follow-up as needed she will work on leg strengthening she has ankle weights at home that she can use.  Follow-up as needed.  Chief Complaint:  Chief Complaint  Patient presents with  . Lower Back - Follow-up    04/10/2020 L4-5 decompression   Visit Diagnoses:  1. History of lumbar laminectomy for spinal cord decompression     Plan: Staples removed return as needed she is happy the surgical result.  Follow-Up Instructions: No follow-ups on file.   Orders:  No orders of the defined types were placed in this encounter.  No orders of the defined types were placed in this encounter.   Imaging: No results found.  PMFS History: Patient Active Problem List   Diagnosis Date Noted  . History of lumbar laminectomy for spinal cord decompression 04/10/2020  . Pre-op evaluation 03/16/2020  . Essential hypertension 03/16/2020  . Abnormal EKG 03/16/2020  . Hx of total hip arthroplasty, right 07/09/2019  . Spondylolisthesis, lumbar region 07/09/2019   Past Medical History:  Diagnosis Date  . Anxiety   . Back ache   . Hypertension     No family history on file.  Past Surgical History:  Procedure Laterality Date  . HIP SURGERY Right   . LUMBAR LAMINECTOMY/DECOMPRESSION MICRODISCECTOMY N/A 04/10/2020   Procedure: L4-5 LUMBAR DECOMPRESSION;  Surgeon: Marybelle Killings, MD;  Location: [facility];  Service: Orthopedics;  Laterality: N/A;  . OPERATIVE HYSTEROSCOPY     Social History   Occupational History  . Not on file  Tobacco Use  . Smoking status: Never Smoker  . Smokeless tobacco: Never Used  Vaping Use  . Vaping Use: Never used  Substance and Sexual Activity  . Alcohol use: Not Currently  . Drug use:  Not Currently  . Sexual activity: Not on file

## 2020-04-28 NOTE — Addendum Note (Signed)
Addended by: Marybelle Killings on: 04/28/2020 04:39 PM   Modules accepted: Orders

## 2020-05-27 DIAGNOSIS — H5203 Hypermetropia, bilateral: Secondary | ICD-10-CM | POA: Diagnosis not present

## 2020-05-27 DIAGNOSIS — H11159 Pinguecula, unspecified eye: Secondary | ICD-10-CM | POA: Diagnosis not present

## 2020-05-27 DIAGNOSIS — H52223 Regular astigmatism, bilateral: Secondary | ICD-10-CM | POA: Diagnosis not present

## 2020-05-27 DIAGNOSIS — H524 Presbyopia: Secondary | ICD-10-CM | POA: Diagnosis not present

## 2020-05-27 DIAGNOSIS — I10 Essential (primary) hypertension: Secondary | ICD-10-CM | POA: Diagnosis not present

## 2020-05-27 DIAGNOSIS — H04123 Dry eye syndrome of bilateral lacrimal glands: Secondary | ICD-10-CM | POA: Diagnosis not present

## 2020-05-27 DIAGNOSIS — H25099 Other age-related incipient cataract, unspecified eye: Secondary | ICD-10-CM | POA: Diagnosis not present

## 2020-06-06 DIAGNOSIS — Z23 Encounter for immunization: Secondary | ICD-10-CM | POA: Diagnosis not present

## 2020-06-11 DIAGNOSIS — M150 Primary generalized (osteo)arthritis: Secondary | ICD-10-CM | POA: Diagnosis not present

## 2020-06-11 DIAGNOSIS — I10 Essential (primary) hypertension: Secondary | ICD-10-CM | POA: Diagnosis not present

## 2020-06-11 DIAGNOSIS — E785 Hyperlipidemia, unspecified: Secondary | ICD-10-CM | POA: Diagnosis not present

## 2020-06-11 DIAGNOSIS — Z23 Encounter for immunization: Secondary | ICD-10-CM | POA: Diagnosis not present

## 2020-06-12 DIAGNOSIS — Z23 Encounter for immunization: Secondary | ICD-10-CM | POA: Diagnosis not present

## 2020-06-22 DIAGNOSIS — Z23 Encounter for immunization: Secondary | ICD-10-CM | POA: Diagnosis not present

## 2020-07-01 DIAGNOSIS — Z23 Encounter for immunization: Secondary | ICD-10-CM | POA: Diagnosis not present

## 2020-07-14 DIAGNOSIS — M150 Primary generalized (osteo)arthritis: Secondary | ICD-10-CM | POA: Diagnosis not present

## 2020-07-14 DIAGNOSIS — E785 Hyperlipidemia, unspecified: Secondary | ICD-10-CM | POA: Diagnosis not present

## 2020-07-14 DIAGNOSIS — I10 Essential (primary) hypertension: Secondary | ICD-10-CM | POA: Diagnosis not present

## 2020-07-14 DIAGNOSIS — I270 Primary pulmonary hypertension: Secondary | ICD-10-CM | POA: Diagnosis not present

## 2020-07-27 ENCOUNTER — Encounter (HOSPITAL_COMMUNITY): Payer: Self-pay | Admitting: Emergency Medicine

## 2020-07-27 ENCOUNTER — Emergency Department (HOSPITAL_COMMUNITY)
Admission: EM | Admit: 2020-07-27 | Discharge: 2020-07-28 | Disposition: A | Discharge status: Home / Self Care | Payer: Medicare Other | Attending: Emergency Medicine | Admitting: Emergency Medicine

## 2020-07-27 ENCOUNTER — Other Ambulatory Visit: Payer: Self-pay

## 2020-07-27 ENCOUNTER — Emergency Department (HOSPITAL_COMMUNITY): Payer: Medicare Other

## 2020-07-27 DIAGNOSIS — R21 Rash and other nonspecific skin eruption: Secondary | ICD-10-CM | POA: Diagnosis not present

## 2020-07-27 DIAGNOSIS — I10 Essential (primary) hypertension: Secondary | ICD-10-CM | POA: Diagnosis not present

## 2020-07-27 DIAGNOSIS — R059 Cough, unspecified: Secondary | ICD-10-CM | POA: Diagnosis not present

## 2020-07-27 DIAGNOSIS — R531 Weakness: Secondary | ICD-10-CM | POA: Diagnosis present

## 2020-07-27 DIAGNOSIS — R000 Tachycardia, unspecified: Secondary | ICD-10-CM | POA: Diagnosis not present

## 2020-07-27 DIAGNOSIS — J90 Pleural effusion, not elsewhere classified: Secondary | ICD-10-CM | POA: Diagnosis not present

## 2020-07-27 DIAGNOSIS — Z96641 Presence of right artificial hip joint: Secondary | ICD-10-CM | POA: Diagnosis not present

## 2020-07-27 DIAGNOSIS — R0602 Shortness of breath: Secondary | ICD-10-CM | POA: Diagnosis not present

## 2020-07-27 DIAGNOSIS — E871 Hypo-osmolality and hyponatremia: Secondary | ICD-10-CM | POA: Diagnosis not present

## 2020-07-27 DIAGNOSIS — N390 Urinary tract infection, site not specified: Secondary | ICD-10-CM | POA: Diagnosis not present

## 2020-07-27 DIAGNOSIS — R0902 Hypoxemia: Secondary | ICD-10-CM | POA: Diagnosis not present

## 2020-07-27 DIAGNOSIS — Z79899 Other long term (current) drug therapy: Secondary | ICD-10-CM | POA: Diagnosis not present

## 2020-07-27 DIAGNOSIS — J9811 Atelectasis: Secondary | ICD-10-CM | POA: Diagnosis not present

## 2020-07-27 LAB — CBC WITH DIFFERENTIAL/PLATELET
Abs Immature Granulocytes: 0.03 10*3/uL (ref 0.00–0.07)
Basophils Absolute: 0.1 10*3/uL (ref 0.0–0.1)
Basophils Relative: 1 %
Eosinophils Absolute: 0.2 10*3/uL (ref 0.0–0.5)
Eosinophils Relative: 2 %
HCT: 32 % — ABNORMAL LOW (ref 36.0–46.0)
Hemoglobin: 10.3 g/dL — ABNORMAL LOW (ref 12.0–15.0)
Immature Granulocytes: 0 %
Lymphocytes Relative: 38 %
Lymphs Abs: 3.5 10*3/uL (ref 0.7–4.0)
MCH: 31.5 pg (ref 26.0–34.0)
MCHC: 32.2 g/dL (ref 30.0–36.0)
MCV: 97.9 fL (ref 80.0–100.0)
Monocytes Absolute: 0.7 10*3/uL (ref 0.1–1.0)
Monocytes Relative: 7 %
Neutro Abs: 4.7 10*3/uL (ref 1.7–7.7)
Neutrophils Relative %: 52 %
Platelets: 424 10*3/uL — ABNORMAL HIGH (ref 150–400)
RBC: 3.27 MIL/uL — ABNORMAL LOW (ref 3.87–5.11)
RDW: 12.3 % (ref 11.5–15.5)
WBC: 9.1 10*3/uL (ref 4.0–10.5)
nRBC: 0 % (ref 0.0–0.2)

## 2020-07-27 LAB — URINALYSIS, ROUTINE W REFLEX MICROSCOPIC
Bilirubin Urine: NEGATIVE
Glucose, UA: NEGATIVE mg/dL
Hgb urine dipstick: NEGATIVE
Ketones, ur: NEGATIVE mg/dL
Nitrite: NEGATIVE
Protein, ur: NEGATIVE mg/dL
Specific Gravity, Urine: 1.016 (ref 1.005–1.030)
pH: 5 (ref 5.0–8.0)

## 2020-07-27 LAB — COMPREHENSIVE METABOLIC PANEL
ALT: 20 U/L (ref 0–44)
AST: 25 U/L (ref 15–41)
Albumin: 3.4 g/dL — ABNORMAL LOW (ref 3.5–5.0)
Alkaline Phosphatase: 63 U/L (ref 38–126)
Anion gap: 13 (ref 5–15)
BUN: 32 mg/dL — ABNORMAL HIGH (ref 8–23)
CO2: 23 mmol/L (ref 22–32)
Calcium: 9.9 mg/dL (ref 8.9–10.3)
Chloride: 93 mmol/L — ABNORMAL LOW (ref 98–111)
Creatinine, Ser: 1.02 mg/dL — ABNORMAL HIGH (ref 0.44–1.00)
GFR, Estimated: 56 mL/min — ABNORMAL LOW (ref >60)
Glucose, Bld: 102 mg/dL — ABNORMAL HIGH (ref 70–99)
Potassium: 3.7 mmol/L (ref 3.5–5.1)
Sodium: 129 mmol/L — ABNORMAL LOW (ref 135–145)
Total Bilirubin: 0.6 mg/dL (ref 0.3–1.2)
Total Protein: 8.5 g/dL — ABNORMAL HIGH (ref 6.5–8.1)

## 2020-07-27 NOTE — ED Triage Notes (Signed)
Patient reports generalized weakness with fatigue , cough and poor appetite for 2 weeks . Denies fever or chills , respirations unlabored .

## 2020-07-27 NOTE — ED Triage Notes (Signed)
Patient arrived with EMS from home family reported increasing fatigue /gen. weakness for several weeks with poor appetite . She almost fell last week while ambulating . CBG= 129 by EMS.

## 2020-07-28 ENCOUNTER — Telehealth: Payer: Self-pay | Admitting: Orthopaedic Surgery

## 2020-07-28 DIAGNOSIS — N390 Urinary tract infection, site not specified: Secondary | ICD-10-CM | POA: Diagnosis not present

## 2020-07-28 MED ORDER — CEPHALEXIN 250 MG PO CAPS
500.0000 mg | ORAL_CAPSULE | Freq: Once | ORAL | Status: AC
  Administered 2020-07-28: 500 mg via ORAL
  Filled 2020-07-28: qty 2

## 2020-07-28 MED ORDER — SODIUM CHLORIDE 0.9 % IV BOLUS
500.0000 mL | Freq: Once | INTRAVENOUS | Status: AC
  Administered 2020-07-28: 500 mL via INTRAVENOUS

## 2020-07-28 MED ORDER — CEPHALEXIN 500 MG PO CAPS: 500.0000 mg | ORAL_CAPSULE | Freq: Two times a day (BID) | ORAL | 0 refills | Status: AC

## 2020-07-28 NOTE — ED Notes (Signed)
Please call daughter Kerry Fort @ [phone] for a status update.  Or call  Son Tanai Bouler. @ [phone]

## 2020-07-28 NOTE — Telephone Encounter (Signed)
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

## 2020-07-28 NOTE — ED Notes (Signed)
Pt was able to ambulate to the bathroom using a walker

## 2020-07-28 NOTE — ED Notes (Signed)
Pt eating plan to walk Pt after finishing lunch

## 2020-07-28 NOTE — ED Provider Notes (Signed)
[facility] EMERGENCY DEPARTMENT Provider Note   CSN: [id number] Arrival date & time: 07/27/20  1956     History Chief Complaint  Patient presents with  . Gen.Weakness    Fatigue/Cough    Courtney Perkins is a 79 y.o. female.  HPI   79 year old female with past medical history of HTN presents the emergency department with weakness and fatigue.  Patient states has been going on the past couple days.  She feels "worn out" and has little energy.  She said decreased appetite and admits that she has not had a lot to eat or drink in the past couple days.  She denies any fever, headache, chest pain, shortness of breath, vomiting, diarrhea.  Patient has had chronic lower back pain for which she has had surgery for, she has had no complications.  Past Medical History:  Diagnosis Date  . Anxiety   . Back ache   . Hypertension     Patient Active Problem List   Diagnosis Date Noted  . History of lumbar laminectomy for spinal cord decompression 04/10/2020  . Pre-op evaluation 03/16/2020  . Essential hypertension 03/16/2020  . Abnormal EKG 03/16/2020  . Hx of total hip arthroplasty, right 07/09/2019  . Spondylolisthesis, lumbar region 07/09/2019    Past Surgical History:  Procedure Laterality Date  . HIP SURGERY Right   . LUMBAR LAMINECTOMY/DECOMPRESSION MICRODISCECTOMY N/A 04/10/2020   Procedure: L4-5 LUMBAR DECOMPRESSION;  Surgeon: Marybelle Killings, MD;  Location: [facility];  Service: Orthopedics;  Laterality: N/A;  . OPERATIVE HYSTEROSCOPY       OB History   No obstetric history on file.     No family history on file.  Social History   Tobacco Use  . Smoking status: Never Smoker  . Smokeless tobacco: Never Used  Vaping Use  . Vaping Use: Never used  Substance Use Topics  . Alcohol use: Not Currently  . Drug use: Not Currently    Home Medications Prior to Admission medications   Medication Sig Start Date End Date Taking? Authorizing Provider   HYDROcodone-acetaminophen (NORCO/VICODIN) 5-325 MG tablet Take 1 tablet by mouth every 6 (six) hours as needed for moderate pain. 04/28/20  Yes Marybelle Killings, MD  lisinopril-hydrochlorothiazide (ZESTORETIC) 20-25 MG tablet Take 1 tablet by mouth daily. 04/11/19  Yes [provider]  Multiple Vitamins-Minerals (CENTRUM ADULTS PO) Take 1 tablet by mouth daily.   Yes [provider]  cephALEXin (KEFLEX) 500 MG capsule Take 1 capsule (500 mg total) by mouth 2 (two) times daily for 7 days. 07/28/20 08/04/20  Demonta Wombles, Alvin Critchley, DO  gabapentin (NEURONTIN) 100 MG capsule Take 1 capsule (100 mg total) by mouth 3 (three) times daily. Patient not taking: Reported on 07/28/2020 12/04/19   Edrick Kins, DPM    Allergies    Patient has no known allergies.  Review of Systems   Review of Systems  Constitutional: Positive for activity change, appetite change and fatigue. Negative for chills, fever and unexpected weight change.  HENT: Negative for congestion.   Eyes: Negative for visual disturbance.  Respiratory: Negative for chest tightness and shortness of breath.   Cardiovascular: Negative for chest pain and palpitations.  Gastrointestinal: Negative for abdominal pain, diarrhea and vomiting.  Genitourinary: Negative for dysuria.  Skin: Negative for rash.  Neurological: Negative for headaches.    Physical Exam Updated Vital Signs BP 115/62   Pulse 95   Temp 98.1 F (36.7 C) (Oral)   Resp 19   Ht  5\' 6"  (1.676 m)   Wt 72 kg   SpO2 100%   BMI 25.62 kg/m   Physical Exam Vitals and nursing note reviewed.  Constitutional:      General: She is not in acute distress.    Appearance: Normal appearance. She is not ill-appearing or toxic-appearing.  HENT:     Head: Normocephalic.     Mouth/Throat:     Mouth: Mucous membranes are moist.  Eyes:     Pupils: Pupils are equal, round, and reactive to light.  Cardiovascular:     Rate and Rhythm: Normal rate.  Pulmonary:     Effort:  Pulmonary effort is normal. No respiratory distress.  Abdominal:     General: There is no distension.     Palpations: Abdomen is soft.     Tenderness: There is no abdominal tenderness. There is no guarding.  Musculoskeletal:        General: No deformity or signs of injury.  Skin:    General: Skin is warm.  Neurological:     General: No focal deficit present.     Mental Status: She is alert and oriented to person, place, and time. Mental status is at baseline.  Psychiatric:        Mood and Affect: Mood normal.     ED Results / Procedures / Treatments   Labs (all labs ordered are listed, but only abnormal results are displayed) Labs Reviewed  CBC WITH DIFFERENTIAL/PLATELET - Abnormal; Notable for the following components:      Result Value   RBC 3.27 (*)    Hemoglobin 10.3 (*)    HCT 32.0 (*)    Platelets 424 (*)    All other components within normal limits  COMPREHENSIVE METABOLIC PANEL - Abnormal; Notable for the following components:   Sodium 129 (*)    Chloride 93 (*)    Glucose, Bld 102 (*)    BUN 32 (*)    Creatinine, Ser 1.02 (*)    Total Protein 8.5 (*)    Albumin 3.4 (*)    GFR, Estimated 56 (*)    All other components within normal limits  URINALYSIS, ROUTINE W REFLEX MICROSCOPIC - Abnormal; Notable for the following components:   APPearance HAZY (*)    Leukocytes,Ua LARGE (*)    Bacteria, UA RARE (*)    All other components within normal limits    EKG EKG Interpretation  Date/Time:  Monday July 27 2020 21:10:58 EST Ventricular Rate:  86 PR Interval:  146 QRS Duration: 98 QT Interval:  350 QTC Calculation: 418 R Axis:   -38 Text Interpretation: Normal sinus rhythm Left axis deviation Minimal voltage criteria for LVH, may be normal variant ( Cornell product ) Anteroseptal infarct , age undetermined Abnormal ECG No significant change since prior 5/11 Confirmed by Aletta Edouard [phone]) on 07/28/2020 11:24:45 AM   Radiology DG Chest 2  View  Result Date: 07/27/2020 CLINICAL DATA:  79 year old female with cough. EXAM: CHEST - 2 VIEW COMPARISON:  Chest radiograph dated 04/01/2020. FINDINGS: Minimal bibasilar atelectasis. No focal consolidation, pleural effusion, pneumothorax. The cardiac silhouette is within limits. No acute osseous pathology. Osteopenia with degenerative changes of the spine. IMPRESSION: No active cardiopulmonary disease. Electronically Signed   By: Anner Crete M.D.   On: 07/27/2020 21:38    Procedures Procedures (including critical care time)  Medications Ordered in ED Medications  cephALEXin (KEFLEX) capsule 500 mg (has no administration in time range)  sodium chloride 0.9 % bolus 500 mL (0  mLs Intravenous Stopped 07/28/20 1449)    ED Course  I have reviewed the triage vital signs and the nursing notes.  Pertinent labs & imaging results that were available during my care of the patient were reviewed by me and considered in my medical decision making (see chart for details).  Clinical Course as of 07/28/20 1644  Tue Jul 28, 2020  1638 Vitals are stable, patient is neuro intact.  Urinalysis is leukoesterase positive with bacteria showing a urinary tract infection.  Patient has baseline AKI with a BUN that is slightly elevated.  Patient also has a sodium of 129.  She has no severe symptoms of hyponatremia.  EKG is normal sinus with normal intervals. [KH]    Clinical Course User Index [KH] Rowland Ericsson, Alvin Critchley, DO   MDM Rules/Calculators/A&P                          79 year old female presents the emergency department with couple days of fatigue and generalized weakness with decreased p.o. intake.  Vitals are stable.  She denies any acute symptoms including fever, headache, chest pain, abdominal pain.  Blood work shows a mild hyponatremia of 129 as well as a urinary tract infection.  I believe the slight hyponatremia is from dehydration and decreased p.o. intake.  Patient received IV fluids.  I believe  a urinary tract infection as a source of her fatigue and malaise and as her p.o. intake improves so will this hyponatremia.  She is on a thiazide, this is not a new medication for her.  Spoke with the daughter at home.  Educated her on the results.  Patient was given her first dose of antibiotic here in the department, she will be placed on an antibiotic.  The patient and the daughter understand that she is to follow-up as an outpatient with her primary doctor for reevaluation of the sodium level and her thiazide medication.  Again she has no severe symptoms in regards to the hyponatremia, I do not believe this indicates admission.  Patient will be discharged and treated as an outpatient.  Discharge plan discussed, patient and daughter both of which verbalizs understanding and agreement. Final Clinical Impression(s) / ED Diagnoses Final diagnoses:  Urinary tract infection without hematuria, site unspecified  Hyponatremia    Rx / DC Orders ED Discharge Orders         Ordered    cephALEXin (KEFLEX) 500 MG capsule  2 times daily        07/28/20 1636           Ahlam Piscitelli, Alvin Critchley, DO 07/28/20 1648

## 2020-07-28 NOTE — Discharge Instructions (Addendum)
You have been seen and discharged from the emergency department.  You were found to have a urinary tract infection as well as slightly low sodium level.  This is most likely from your decreased oral intake and dehydration.  You were given your first dose of antibiotic in the department, your next dose is due tomorrow morning (12/15) in the a.m.  Follow-up with your primary provider for reevaluation and repeat blood work to evaluate sodium level. It is possible that the medication lisinopril-hydrochlorothiazide can cause low sodium. Your PCP should re evaluate this medication. Take new prescription and home medications as prescribed. If you have any worsening symptoms or further concerns or health please return to emergency department for further evaluation.

## 2020-07-29 ENCOUNTER — Ambulatory Visit: Payer: Medicare Other | Admitting: Orthopaedic Surgery

## 2020-08-04 DIAGNOSIS — E871 Hypo-osmolality and hyponatremia: Secondary | ICD-10-CM | POA: Diagnosis not present

## 2020-08-04 DIAGNOSIS — F064 Anxiety disorder due to known physiological condition: Secondary | ICD-10-CM | POA: Diagnosis not present

## 2020-08-04 DIAGNOSIS — N341 Nonspecific urethritis: Secondary | ICD-10-CM | POA: Diagnosis not present

## 2020-08-14 DIAGNOSIS — I10 Essential (primary) hypertension: Secondary | ICD-10-CM | POA: Diagnosis not present

## 2020-08-14 DIAGNOSIS — E785 Hyperlipidemia, unspecified: Secondary | ICD-10-CM | POA: Diagnosis not present

## 2020-08-14 DIAGNOSIS — I270 Primary pulmonary hypertension: Secondary | ICD-10-CM | POA: Diagnosis not present

## 2020-08-14 DIAGNOSIS — M150 Primary generalized (osteo)arthritis: Secondary | ICD-10-CM | POA: Diagnosis not present

## 2020-09-08 DIAGNOSIS — E782 Mixed hyperlipidemia: Secondary | ICD-10-CM | POA: Diagnosis not present

## 2020-09-08 DIAGNOSIS — M130 Polyarthritis, unspecified: Secondary | ICD-10-CM | POA: Diagnosis not present

## 2020-09-08 DIAGNOSIS — N189 Chronic kidney disease, unspecified: Secondary | ICD-10-CM | POA: Diagnosis not present

## 2020-09-08 DIAGNOSIS — I10 Essential (primary) hypertension: Secondary | ICD-10-CM | POA: Diagnosis not present

## 2020-09-08 DIAGNOSIS — Z0000 Encounter for general adult medical examination without abnormal findings: Secondary | ICD-10-CM | POA: Diagnosis not present

## 2020-09-08 DIAGNOSIS — M48062 Spinal stenosis, lumbar region with neurogenic claudication: Secondary | ICD-10-CM | POA: Diagnosis not present

## 2020-11-09 DIAGNOSIS — E782 Mixed hyperlipidemia: Secondary | ICD-10-CM | POA: Diagnosis not present

## 2020-11-09 DIAGNOSIS — F064 Anxiety disorder due to known physiological condition: Secondary | ICD-10-CM | POA: Diagnosis not present

## 2020-11-09 DIAGNOSIS — M130 Polyarthritis, unspecified: Secondary | ICD-10-CM | POA: Diagnosis not present

## 2020-11-09 DIAGNOSIS — I10 Essential (primary) hypertension: Secondary | ICD-10-CM | POA: Diagnosis not present

## 2020-11-09 DIAGNOSIS — M48062 Spinal stenosis, lumbar region with neurogenic claudication: Secondary | ICD-10-CM | POA: Diagnosis not present

## 2020-12-12 DIAGNOSIS — M150 Primary generalized (osteo)arthritis: Secondary | ICD-10-CM | POA: Diagnosis not present

## 2020-12-12 DIAGNOSIS — I10 Essential (primary) hypertension: Secondary | ICD-10-CM | POA: Diagnosis not present

## 2020-12-12 DIAGNOSIS — E785 Hyperlipidemia, unspecified: Secondary | ICD-10-CM | POA: Diagnosis not present

## 2021-02-11 DIAGNOSIS — I10 Essential (primary) hypertension: Secondary | ICD-10-CM | POA: Diagnosis not present

## 2021-02-11 DIAGNOSIS — M150 Primary generalized (osteo)arthritis: Secondary | ICD-10-CM | POA: Diagnosis not present

## 2021-02-11 DIAGNOSIS — I270 Primary pulmonary hypertension: Secondary | ICD-10-CM | POA: Diagnosis not present

## 2021-02-11 DIAGNOSIS — E785 Hyperlipidemia, unspecified: Secondary | ICD-10-CM | POA: Diagnosis not present

## 2021-02-22 DIAGNOSIS — M48062 Spinal stenosis, lumbar region with neurogenic claudication: Secondary | ICD-10-CM | POA: Diagnosis not present

## 2021-02-22 DIAGNOSIS — M130 Polyarthritis, unspecified: Secondary | ICD-10-CM | POA: Diagnosis not present

## 2021-02-22 DIAGNOSIS — F064 Anxiety disorder due to known physiological condition: Secondary | ICD-10-CM | POA: Diagnosis not present

## 2021-02-22 DIAGNOSIS — N189 Chronic kidney disease, unspecified: Secondary | ICD-10-CM | POA: Diagnosis not present

## 2021-02-22 DIAGNOSIS — I10 Essential (primary) hypertension: Secondary | ICD-10-CM | POA: Diagnosis not present

## 2021-02-22 DIAGNOSIS — R251 Tremor, unspecified: Secondary | ICD-10-CM | POA: Diagnosis not present

## 2021-02-22 DIAGNOSIS — E785 Hyperlipidemia, unspecified: Secondary | ICD-10-CM | POA: Diagnosis not present

## 2021-04-14 DIAGNOSIS — E785 Hyperlipidemia, unspecified: Secondary | ICD-10-CM | POA: Diagnosis not present

## 2021-04-14 DIAGNOSIS — M150 Primary generalized (osteo)arthritis: Secondary | ICD-10-CM | POA: Diagnosis not present

## 2021-04-14 DIAGNOSIS — I270 Primary pulmonary hypertension: Secondary | ICD-10-CM | POA: Diagnosis not present

## 2021-04-14 DIAGNOSIS — I10 Essential (primary) hypertension: Secondary | ICD-10-CM | POA: Diagnosis not present

## 2021-05-14 DIAGNOSIS — E785 Hyperlipidemia, unspecified: Secondary | ICD-10-CM | POA: Diagnosis not present

## 2021-05-14 DIAGNOSIS — M150 Primary generalized (osteo)arthritis: Secondary | ICD-10-CM | POA: Diagnosis not present

## 2021-05-14 DIAGNOSIS — I270 Primary pulmonary hypertension: Secondary | ICD-10-CM | POA: Diagnosis not present

## 2021-05-14 DIAGNOSIS — I10 Essential (primary) hypertension: Secondary | ICD-10-CM | POA: Diagnosis not present

## 2021-05-21 DIAGNOSIS — Z1231 Encounter for screening mammogram for malignant neoplasm of breast: Secondary | ICD-10-CM | POA: Diagnosis not present

## 2021-06-25 DIAGNOSIS — N189 Chronic kidney disease, unspecified: Secondary | ICD-10-CM | POA: Diagnosis not present

## 2021-06-25 DIAGNOSIS — F064 Anxiety disorder due to known physiological condition: Secondary | ICD-10-CM | POA: Diagnosis not present

## 2021-06-25 DIAGNOSIS — E785 Hyperlipidemia, unspecified: Secondary | ICD-10-CM | POA: Diagnosis not present

## 2021-06-25 DIAGNOSIS — Z23 Encounter for immunization: Secondary | ICD-10-CM | POA: Diagnosis not present

## 2021-06-25 DIAGNOSIS — M130 Polyarthritis, unspecified: Secondary | ICD-10-CM | POA: Diagnosis not present

## 2021-06-25 DIAGNOSIS — I10 Essential (primary) hypertension: Secondary | ICD-10-CM | POA: Diagnosis not present

## 2021-07-12 DIAGNOSIS — Z23 Encounter for immunization: Secondary | ICD-10-CM | POA: Diagnosis not present

## 2021-08-13 DIAGNOSIS — E785 Hyperlipidemia, unspecified: Secondary | ICD-10-CM | POA: Diagnosis not present

## 2021-08-13 DIAGNOSIS — M150 Primary generalized (osteo)arthritis: Secondary | ICD-10-CM | POA: Diagnosis not present

## 2021-08-13 DIAGNOSIS — I10 Essential (primary) hypertension: Secondary | ICD-10-CM | POA: Diagnosis not present

## 2021-08-13 DIAGNOSIS — I270 Primary pulmonary hypertension: Secondary | ICD-10-CM | POA: Diagnosis not present

## 2021-10-25 DIAGNOSIS — E785 Hyperlipidemia, unspecified: Secondary | ICD-10-CM | POA: Diagnosis not present

## 2021-10-25 DIAGNOSIS — N189 Chronic kidney disease, unspecified: Secondary | ICD-10-CM | POA: Diagnosis not present

## 2021-10-25 DIAGNOSIS — Z6823 Body mass index (BMI) 23.0-23.9, adult: Secondary | ICD-10-CM | POA: Diagnosis not present

## 2021-10-25 DIAGNOSIS — M130 Polyarthritis, unspecified: Secondary | ICD-10-CM | POA: Diagnosis not present

## 2021-10-25 DIAGNOSIS — G629 Polyneuropathy, unspecified: Secondary | ICD-10-CM | POA: Diagnosis not present

## 2021-10-25 DIAGNOSIS — I10 Essential (primary) hypertension: Secondary | ICD-10-CM | POA: Diagnosis not present

## 2022-03-16 DIAGNOSIS — N183 Chronic kidney disease, stage 3 unspecified: Secondary | ICD-10-CM | POA: Diagnosis not present

## 2022-03-16 DIAGNOSIS — M4316 Spondylolisthesis, lumbar region: Secondary | ICD-10-CM | POA: Diagnosis not present

## 2022-03-16 DIAGNOSIS — Z6822 Body mass index (BMI) 22.0-22.9, adult: Secondary | ICD-10-CM | POA: Diagnosis not present

## 2022-03-16 DIAGNOSIS — M150 Primary generalized (osteo)arthritis: Secondary | ICD-10-CM | POA: Diagnosis not present

## 2022-03-16 DIAGNOSIS — E785 Hyperlipidemia, unspecified: Secondary | ICD-10-CM | POA: Diagnosis not present

## 2022-03-16 DIAGNOSIS — I10 Essential (primary) hypertension: Secondary | ICD-10-CM | POA: Diagnosis not present

## 2022-06-10 DIAGNOSIS — Z1231 Encounter for screening mammogram for malignant neoplasm of breast: Secondary | ICD-10-CM | POA: Diagnosis not present

## 2022-06-24 DIAGNOSIS — Z23 Encounter for immunization: Secondary | ICD-10-CM | POA: Diagnosis not present

## 2022-07-08 IMAGING — CR DG LUMBAR SPINE 2-3V
2 series · 2 of 2 positions shown · non-contrast
Comparison: January 28, 2020 lumbar radiograph; lumbar MRI January 23, 2020

CLINICAL DATA: Localization images for laminectomy

EXAM:
LUMBAR SPINE - 2-3 VIEW

[lateral (1 of 2)]
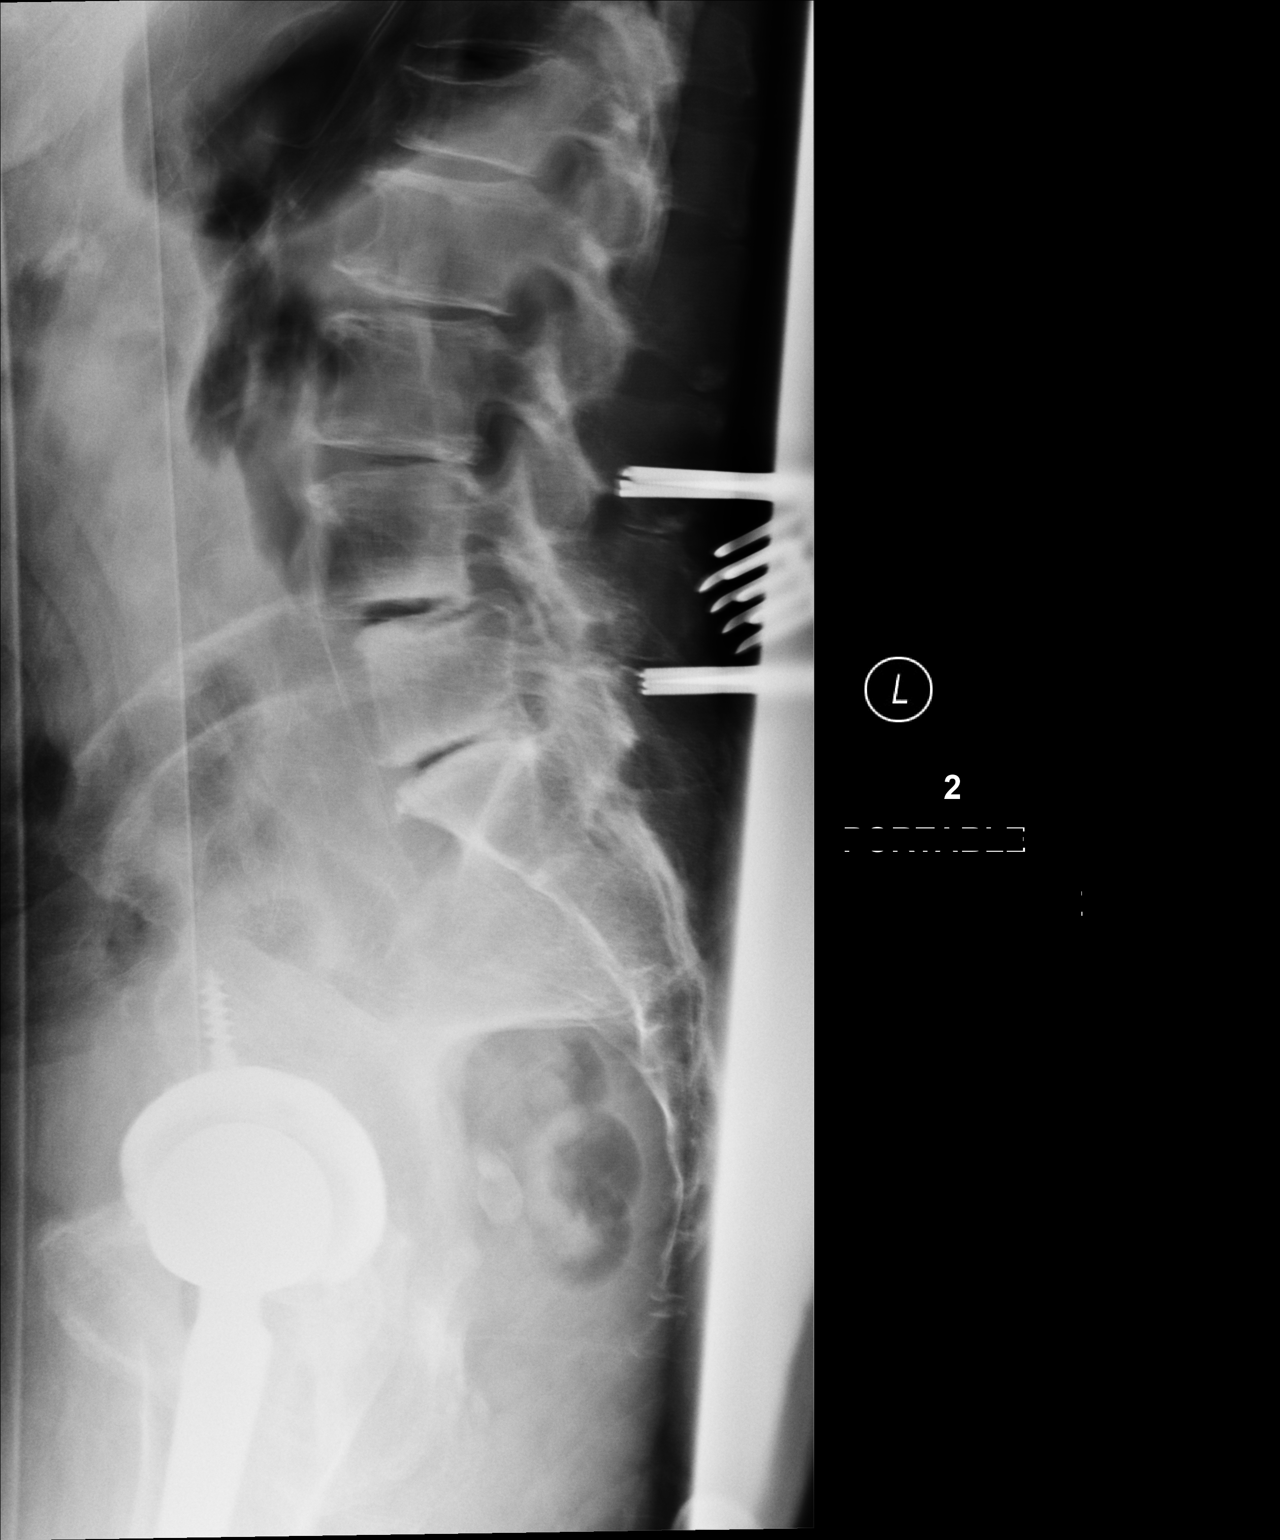

[lateral (2 of 2)]
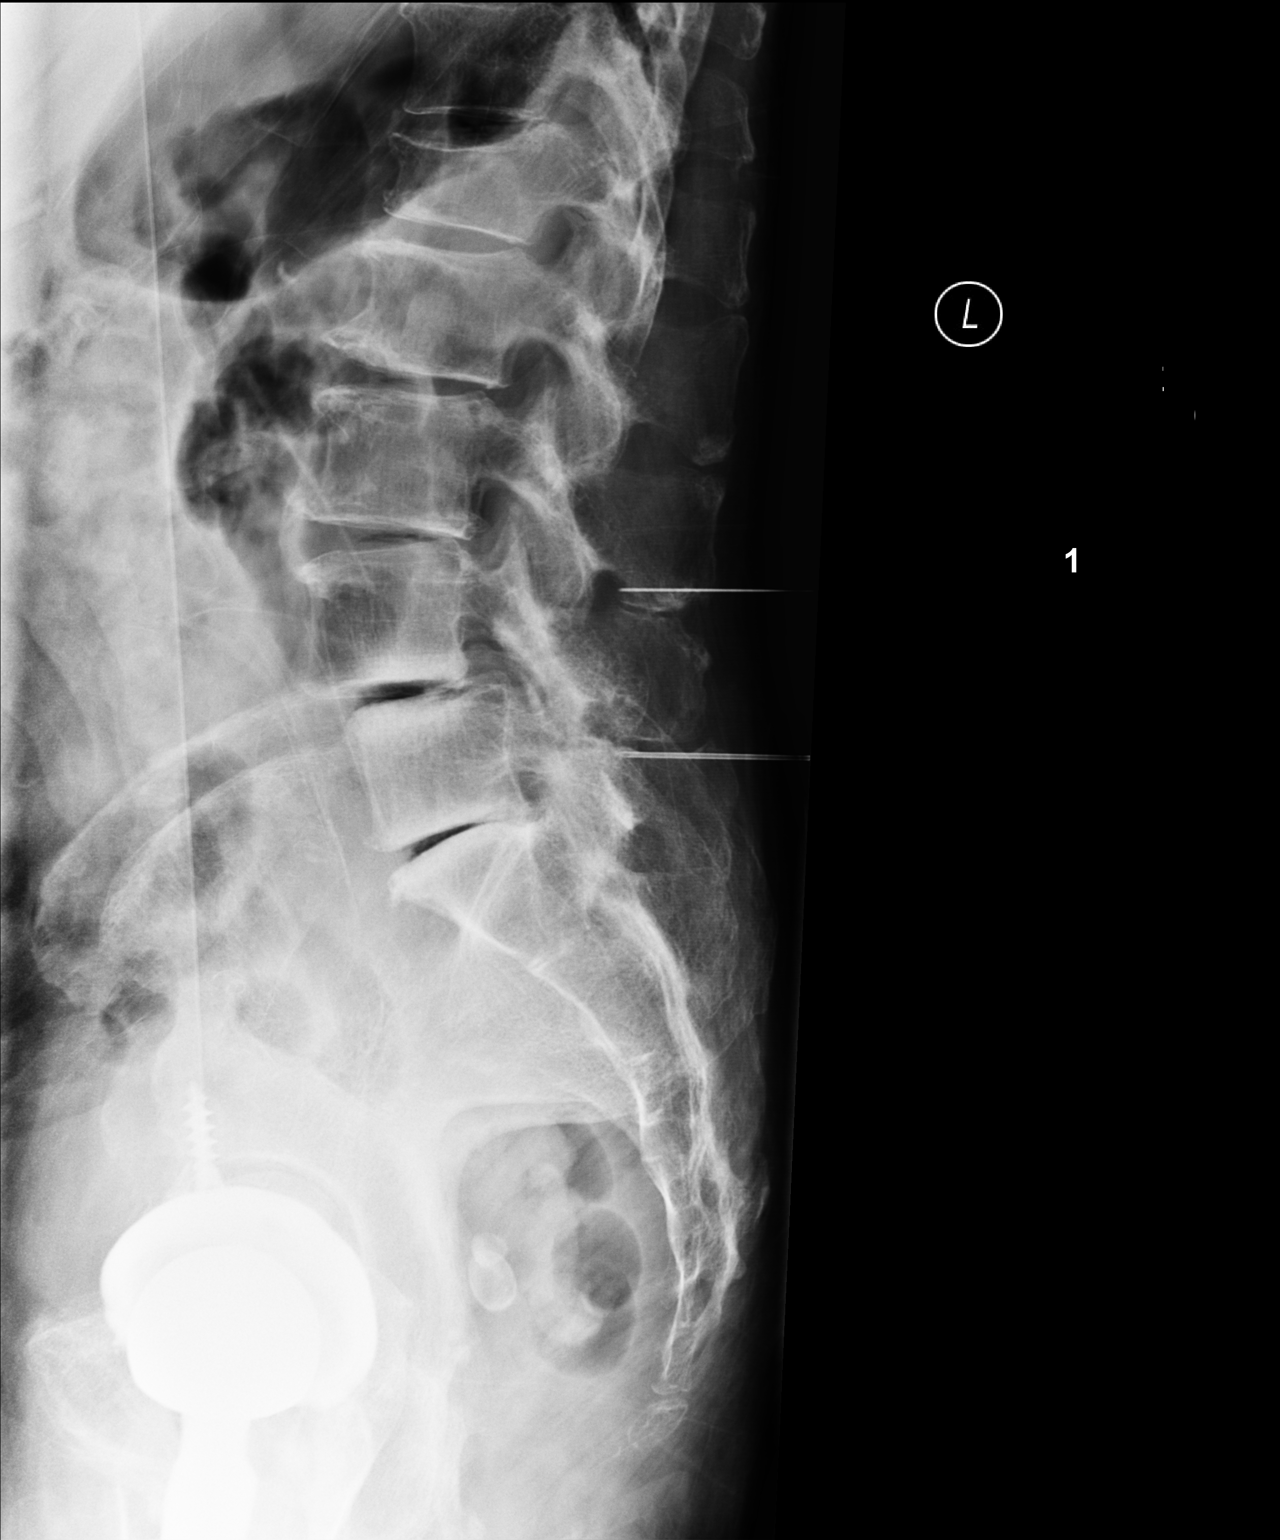

[2 of 2 positions shown; findings below may reference images not displayed]

FINDINGS: On lateral lumbar image labeled #1, there is a metallic probe with
tip posterior to the mid L4 vertebral body with a second probe with
tip posterior to the mid L5 vertebral body. There is 9 mm of
anterolisthesis of L4 on L5. No other spondylolisthesis. There is
stable anterior wedging of the L1 vertebral body. There is vacuum
phenomenon at L3-4, L4-5, and L5-S1.

On lateral lumbar image labeled #2, metallic probe tip is posterior
to the L3-4 level. A second metallic probe tip is posterior to the
inferior aspect of the L5 vertebral body. The 9 mm of
anterolisthesis of L4 on L5 persists without change. No new
spondylolisthesis. Anterior wedging of L1 is stable. Severe disc
space narrowing at multiple levels with vacuum phenomenon at L3-4,
L4-5, and L5-S1 noted.
IMPRESSION: On final submitted cross-table lateral lumbar image, metallic probe
tips are posterior to the L3-4 and inferior L5 levels respectively.
There is 9 mm of anterolisthesis of L4 on L5. No other
spondylolisthesis. There is moderate anterior wedging of L1, stable.
No new fracture. Multilevel osteoarthritic change with vacuum
phenomenon in the L3-4, L4-5, and L5-S1 discs.

## 2022-07-15 DIAGNOSIS — Z23 Encounter for immunization: Secondary | ICD-10-CM | POA: Diagnosis not present

## 2022-07-19 DIAGNOSIS — Z9989 Dependence on other enabling machines and devices: Secondary | ICD-10-CM | POA: Diagnosis not present

## 2022-07-19 DIAGNOSIS — E785 Hyperlipidemia, unspecified: Secondary | ICD-10-CM | POA: Diagnosis not present

## 2022-07-19 DIAGNOSIS — F064 Anxiety disorder due to known physiological condition: Secondary | ICD-10-CM | POA: Diagnosis not present

## 2022-10-10 DIAGNOSIS — I10 Essential (primary) hypertension: Secondary | ICD-10-CM | POA: Diagnosis not present

## 2022-10-10 DIAGNOSIS — E782 Mixed hyperlipidemia: Secondary | ICD-10-CM | POA: Diagnosis not present

## 2022-10-18 DIAGNOSIS — F064 Anxiety disorder due to known physiological condition: Secondary | ICD-10-CM | POA: Diagnosis not present

## 2022-10-18 DIAGNOSIS — M48062 Spinal stenosis, lumbar region with neurogenic claudication: Secondary | ICD-10-CM | POA: Diagnosis not present

## 2022-10-18 DIAGNOSIS — E785 Hyperlipidemia, unspecified: Secondary | ICD-10-CM | POA: Diagnosis not present

## 2022-10-18 DIAGNOSIS — I10 Essential (primary) hypertension: Secondary | ICD-10-CM | POA: Diagnosis not present

## 2022-10-18 DIAGNOSIS — M130 Polyarthritis, unspecified: Secondary | ICD-10-CM | POA: Diagnosis not present

## 2022-10-24 DIAGNOSIS — H2513 Age-related nuclear cataract, bilateral: Secondary | ICD-10-CM | POA: Diagnosis not present

## 2022-10-24 DIAGNOSIS — H52223 Regular astigmatism, bilateral: Secondary | ICD-10-CM | POA: Diagnosis not present

## 2022-10-24 DIAGNOSIS — H524 Presbyopia: Secondary | ICD-10-CM | POA: Diagnosis not present

## 2022-10-24 DIAGNOSIS — Z135 Encounter for screening for eye and ear disorders: Secondary | ICD-10-CM | POA: Diagnosis not present

## 2022-10-24 DIAGNOSIS — H5203 Hypermetropia, bilateral: Secondary | ICD-10-CM | POA: Diagnosis not present

## 2022-10-24 IMAGING — CR DG CHEST 2V
2 series · 2 of 2 positions shown · non-contrast
Comparison: Chest radiograph dated 04/01/2020.

CLINICAL DATA: 79-year-old female with cough.

EXAM:
CHEST - 2 VIEW

[chest lat]
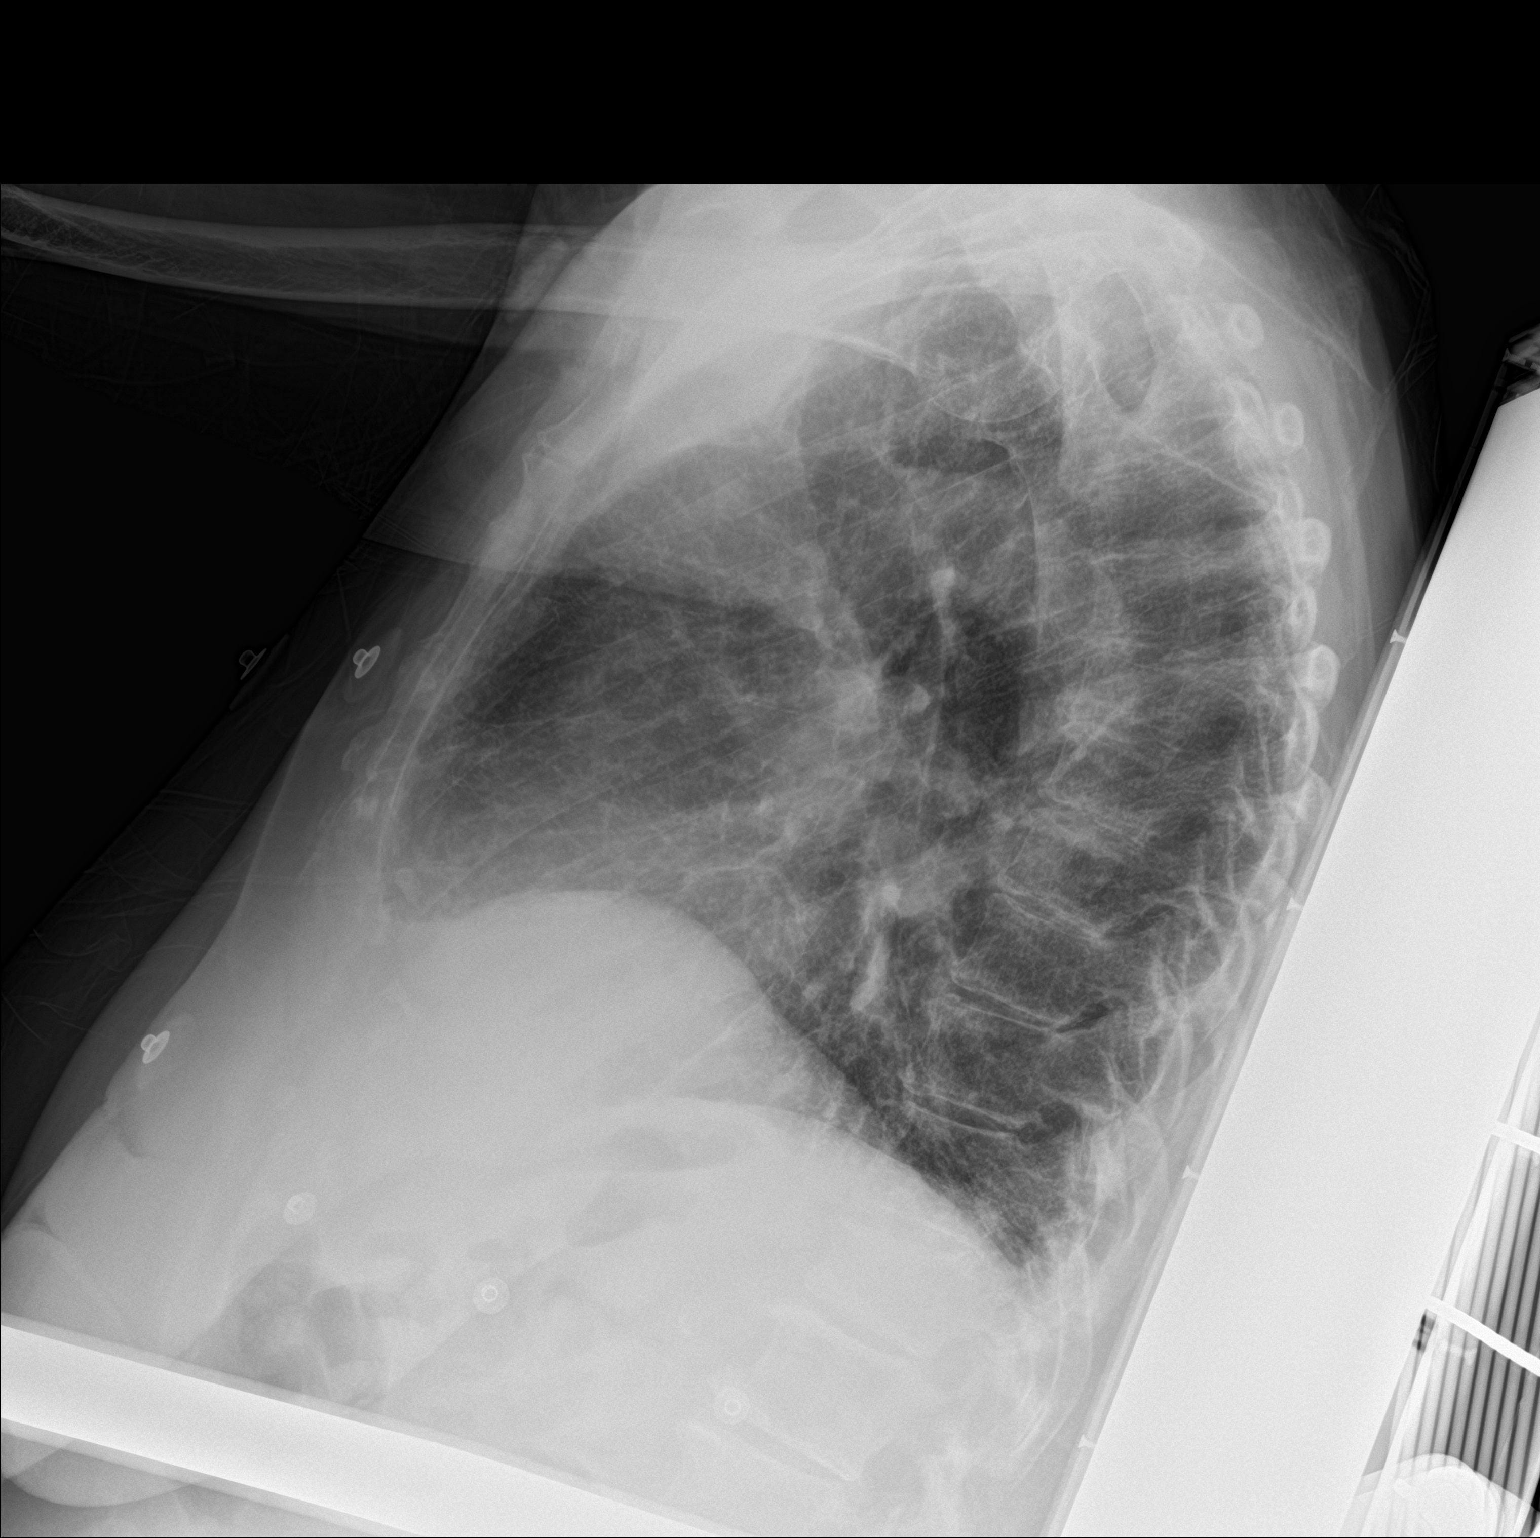

[chest ap]
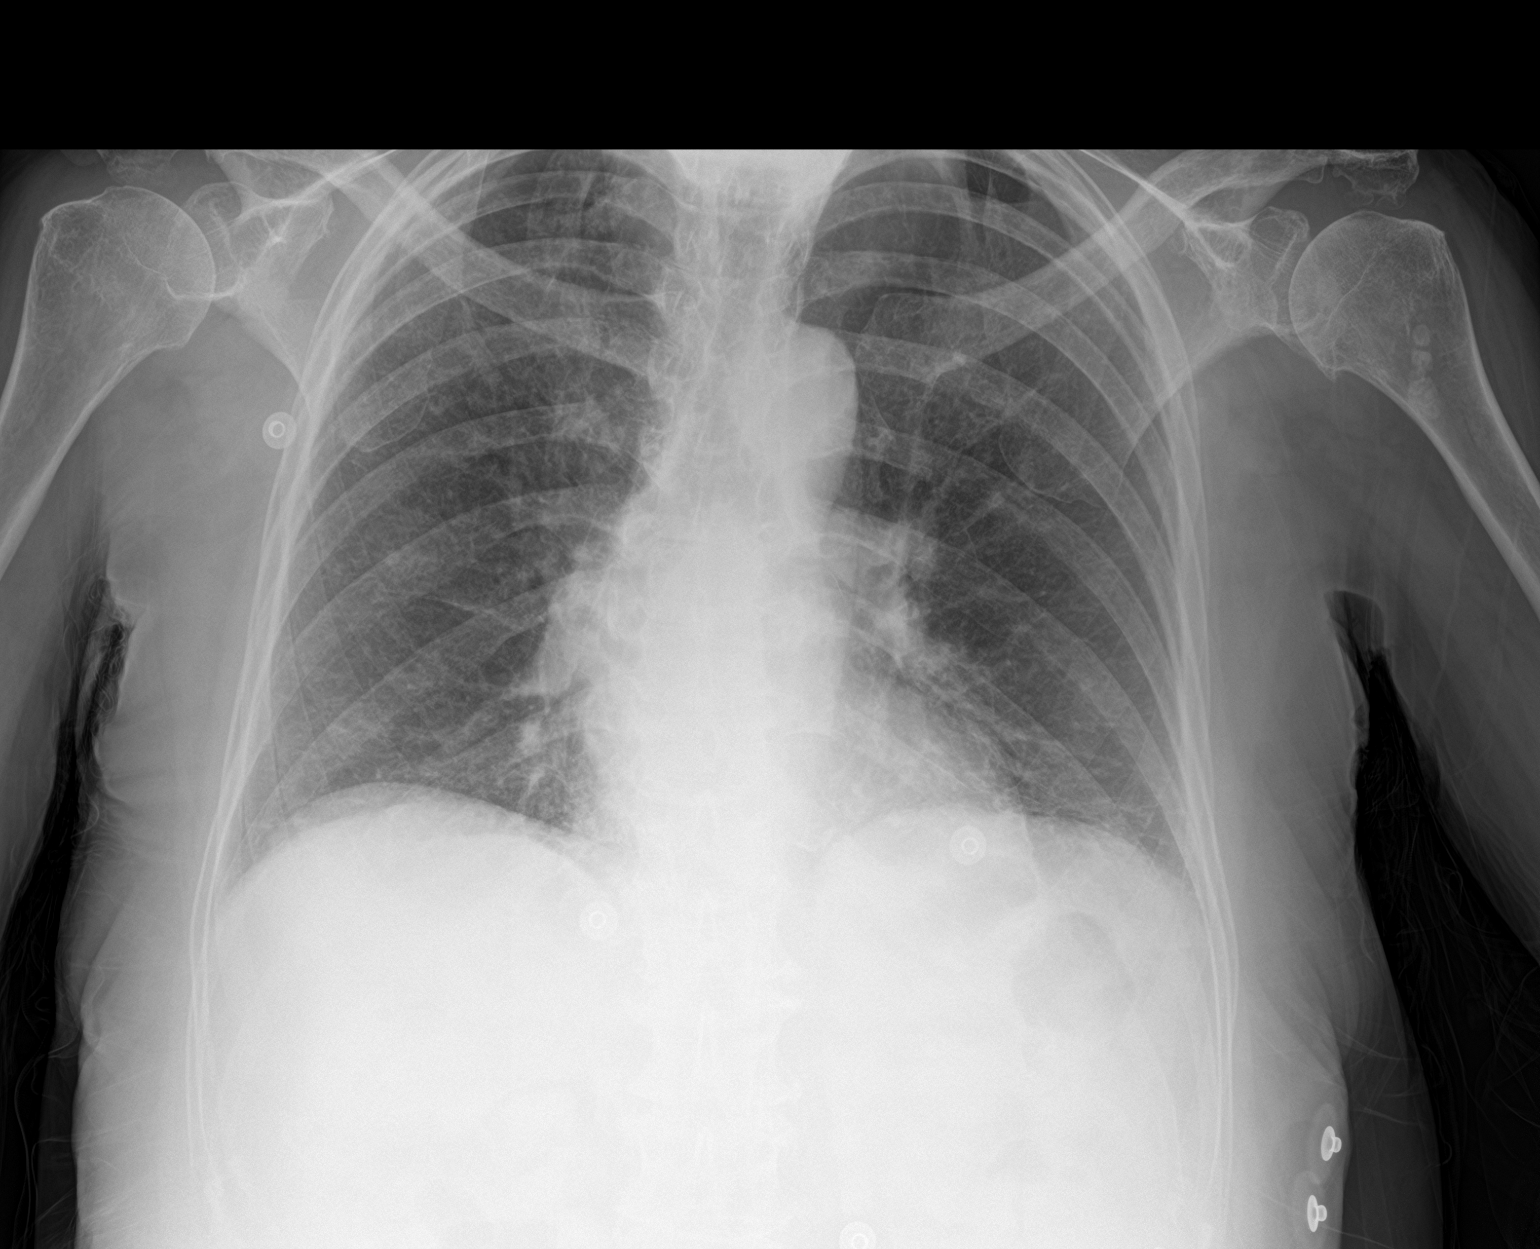

[2 of 2 positions shown; findings below may reference images not displayed]

FINDINGS: Minimal bibasilar atelectasis. No focal consolidation, pleural
effusion, pneumothorax. The cardiac silhouette is within limits. No
acute osseous pathology. Osteopenia with degenerative changes of the
spine.
IMPRESSION: No active cardiopulmonary disease.

## 2023-01-05 DIAGNOSIS — H25043 Posterior subcapsular polar age-related cataract, bilateral: Secondary | ICD-10-CM | POA: Diagnosis not present

## 2023-01-05 DIAGNOSIS — H18413 Arcus senilis, bilateral: Secondary | ICD-10-CM | POA: Diagnosis not present

## 2023-01-05 DIAGNOSIS — H2513 Age-related nuclear cataract, bilateral: Secondary | ICD-10-CM | POA: Diagnosis not present

## 2023-01-05 DIAGNOSIS — H25013 Cortical age-related cataract, bilateral: Secondary | ICD-10-CM | POA: Diagnosis not present

## 2023-01-05 DIAGNOSIS — H2511 Age-related nuclear cataract, right eye: Secondary | ICD-10-CM | POA: Diagnosis not present

## 2023-02-14 DIAGNOSIS — M130 Polyarthritis, unspecified: Secondary | ICD-10-CM | POA: Diagnosis not present

## 2023-02-14 DIAGNOSIS — I10 Essential (primary) hypertension: Secondary | ICD-10-CM | POA: Diagnosis not present

## 2023-02-14 DIAGNOSIS — Z0001 Encounter for general adult medical examination with abnormal findings: Secondary | ICD-10-CM | POA: Diagnosis not present

## 2023-03-28 DIAGNOSIS — N183 Chronic kidney disease, stage 3 unspecified: Secondary | ICD-10-CM | POA: Diagnosis not present

## 2023-03-28 DIAGNOSIS — M130 Polyarthritis, unspecified: Secondary | ICD-10-CM | POA: Diagnosis not present

## 2023-03-28 DIAGNOSIS — I10 Essential (primary) hypertension: Secondary | ICD-10-CM | POA: Diagnosis not present

## 2023-03-28 DIAGNOSIS — J399 Disease of upper respiratory tract, unspecified: Secondary | ICD-10-CM | POA: Diagnosis not present

## 2023-03-28 DIAGNOSIS — F064 Anxiety disorder due to known physiological condition: Secondary | ICD-10-CM | POA: Diagnosis not present

## 2023-04-10 DIAGNOSIS — I10 Essential (primary) hypertension: Secondary | ICD-10-CM | POA: Diagnosis not present

## 2023-04-12 DIAGNOSIS — H2511 Age-related nuclear cataract, right eye: Secondary | ICD-10-CM | POA: Diagnosis not present

## 2023-04-12 DIAGNOSIS — H25011 Cortical age-related cataract, right eye: Secondary | ICD-10-CM | POA: Diagnosis not present

## 2023-04-13 DIAGNOSIS — H25012 Cortical age-related cataract, left eye: Secondary | ICD-10-CM | POA: Diagnosis not present

## 2023-04-13 DIAGNOSIS — H25042 Posterior subcapsular polar age-related cataract, left eye: Secondary | ICD-10-CM | POA: Diagnosis not present

## 2023-04-13 DIAGNOSIS — H2512 Age-related nuclear cataract, left eye: Secondary | ICD-10-CM | POA: Diagnosis not present

## 2023-04-19 DIAGNOSIS — H25011 Cortical age-related cataract, right eye: Secondary | ICD-10-CM | POA: Diagnosis not present

## 2023-04-26 DIAGNOSIS — H25812 Combined forms of age-related cataract, left eye: Secondary | ICD-10-CM | POA: Diagnosis not present

## 2023-04-26 DIAGNOSIS — H2512 Age-related nuclear cataract, left eye: Secondary | ICD-10-CM | POA: Diagnosis not present

## 2023-05-22 DIAGNOSIS — Z23 Encounter for immunization: Secondary | ICD-10-CM | POA: Diagnosis not present

## 2023-06-23 DIAGNOSIS — Z1231 Encounter for screening mammogram for malignant neoplasm of breast: Secondary | ICD-10-CM | POA: Diagnosis not present

## 2023-08-22 DIAGNOSIS — I10 Essential (primary) hypertension: Secondary | ICD-10-CM | POA: Diagnosis not present

## 2023-08-22 DIAGNOSIS — M130 Polyarthritis, unspecified: Secondary | ICD-10-CM | POA: Diagnosis not present

## 2023-08-22 DIAGNOSIS — M4316 Spondylolisthesis, lumbar region: Secondary | ICD-10-CM | POA: Diagnosis not present

## 2023-08-22 DIAGNOSIS — E785 Hyperlipidemia, unspecified: Secondary | ICD-10-CM | POA: Diagnosis not present

## 2023-09-05 DIAGNOSIS — Z23 Encounter for immunization: Secondary | ICD-10-CM | POA: Diagnosis not present

## 2023-12-21 DIAGNOSIS — M48062 Spinal stenosis, lumbar region with neurogenic claudication: Secondary | ICD-10-CM | POA: Diagnosis not present

## 2023-12-21 DIAGNOSIS — I10 Essential (primary) hypertension: Secondary | ICD-10-CM | POA: Diagnosis not present

## 2023-12-21 DIAGNOSIS — E559 Vitamin D deficiency, unspecified: Secondary | ICD-10-CM | POA: Diagnosis not present

## 2023-12-21 DIAGNOSIS — Z6822 Body mass index (BMI) 22.0-22.9, adult: Secondary | ICD-10-CM | POA: Diagnosis not present

## 2023-12-21 DIAGNOSIS — M130 Polyarthritis, unspecified: Secondary | ICD-10-CM | POA: Diagnosis not present

## 2023-12-21 DIAGNOSIS — E785 Hyperlipidemia, unspecified: Secondary | ICD-10-CM | POA: Diagnosis not present

## 2024-01-24 DIAGNOSIS — I10 Essential (primary) hypertension: Secondary | ICD-10-CM | POA: Diagnosis not present

## 2024-01-24 DIAGNOSIS — R799 Abnormal finding of blood chemistry, unspecified: Secondary | ICD-10-CM | POA: Diagnosis not present

## 2024-04-25 DIAGNOSIS — N183 Chronic kidney disease, stage 3 unspecified: Secondary | ICD-10-CM | POA: Diagnosis not present

## 2024-04-25 DIAGNOSIS — Z1211 Encounter for screening for malignant neoplasm of colon: Secondary | ICD-10-CM | POA: Diagnosis not present

## 2024-04-25 DIAGNOSIS — Z1212 Encounter for screening for malignant neoplasm of rectum: Secondary | ICD-10-CM | POA: Diagnosis not present

## 2024-04-25 DIAGNOSIS — M48062 Spinal stenosis, lumbar region with neurogenic claudication: Secondary | ICD-10-CM | POA: Diagnosis not present

## 2024-04-25 DIAGNOSIS — M130 Polyarthritis, unspecified: Secondary | ICD-10-CM | POA: Diagnosis not present

## 2024-04-25 DIAGNOSIS — Z6822 Body mass index (BMI) 22.0-22.9, adult: Secondary | ICD-10-CM | POA: Diagnosis not present

## 2024-04-25 DIAGNOSIS — I129 Hypertensive chronic kidney disease with stage 1 through stage 4 chronic kidney disease, or unspecified chronic kidney disease: Secondary | ICD-10-CM | POA: Diagnosis not present

## 2024-04-25 DIAGNOSIS — I10 Essential (primary) hypertension: Secondary | ICD-10-CM | POA: Diagnosis not present

## 2024-05-25 DIAGNOSIS — Z23 Encounter for immunization: Secondary | ICD-10-CM | POA: Diagnosis not present

## 2024-06-28 DIAGNOSIS — Z1231 Encounter for screening mammogram for malignant neoplasm of breast: Secondary | ICD-10-CM | POA: Diagnosis not present
